# Patient Record
Sex: Male | Born: 1950 | Race: White | Hispanic: No | Marital: Married | Smoking: Never smoker
Health system: Southern US, Community
[De-identification: ages and names within clinical notes are randomized; demographics above are authoritative.]

## PROBLEM LIST (undated history)

## (undated) DIAGNOSIS — J45909 Unspecified asthma, uncomplicated: Secondary | ICD-10-CM

## (undated) DIAGNOSIS — E785 Hyperlipidemia, unspecified: Secondary | ICD-10-CM

## (undated) DIAGNOSIS — I10 Essential (primary) hypertension: Secondary | ICD-10-CM

## (undated) HISTORY — DX: Hyperlipidemia, unspecified: E78.5

## (undated) HISTORY — DX: Unspecified asthma, uncomplicated: J45.909

## (undated) HISTORY — DX: Essential (primary) hypertension: I10

## (undated) HISTORY — PX: COLONOSCOPY: SHX174

---

## 1990-04-25 HISTORY — PX: ARM WOUND REPAIR / CLOSURE: SUR1141

## 2005-01-31 ENCOUNTER — Encounter: Admission: RE | Admit: 2005-01-31 | Discharge: 2005-01-31 | Payer: Self-pay | Admitting: Vascular Surgery

## 2005-02-10 ENCOUNTER — Encounter: Payer: Self-pay | Admitting: Critical Care Medicine

## 2005-02-10 ENCOUNTER — Encounter: Admission: RE | Admit: 2005-02-10 | Discharge: 2005-02-10 | Payer: Self-pay | Admitting: Vascular Surgery

## 2005-02-14 ENCOUNTER — Ambulatory Visit (HOSPITAL_COMMUNITY): Admission: RE | Admit: 2005-02-14 | Discharge: 2005-02-14 | Payer: Self-pay | Admitting: Cardiothoracic Surgery

## 2005-02-14 ENCOUNTER — Encounter (INDEPENDENT_AMBULATORY_CARE_PROVIDER_SITE_OTHER): Payer: Self-pay | Admitting: Specialist

## 2005-05-12 ENCOUNTER — Encounter: Admission: RE | Admit: 2005-05-12 | Discharge: 2005-05-12 | Payer: Self-pay | Admitting: Cardiothoracic Surgery

## 2005-05-20 ENCOUNTER — Ambulatory Visit: Payer: Self-pay | Admitting: Critical Care Medicine

## 2005-06-10 ENCOUNTER — Ambulatory Visit: Payer: Self-pay | Admitting: Critical Care Medicine

## 2005-07-14 ENCOUNTER — Ambulatory Visit: Payer: Self-pay | Admitting: Critical Care Medicine

## 2005-10-11 ENCOUNTER — Ambulatory Visit: Payer: Self-pay | Admitting: Critical Care Medicine

## 2006-03-22 ENCOUNTER — Ambulatory Visit: Payer: Self-pay | Admitting: Critical Care Medicine

## 2006-07-26 IMAGING — CT CT BIOPSY
1 series · 14 of 33 positions shown, 18 images · non-contrast
Comparison: Procedure related pneumothorax evacuated with a pneumocentesis.

CLINICAL DATA: 54-year-old male who has a 2.3 cm right lower lobe lobulated mass originally identified by CT of the abdomen.  The patient had a CT scan subsequently on 02/10/2005 demonstrating the lobulated branching type lesion with adjacent pneumatoceles and blebs.  The findings are confined to the right lower lobe.  
Radiologist:  Dr. Lightfoot
Guidance:  Noncontrast CT

[Series 2: routine chest · axial · 0.76mm/px · z∈[-224,-144]mm · 14 of 109 slices shown, 18 images]
[im 9/109  mediastinal]
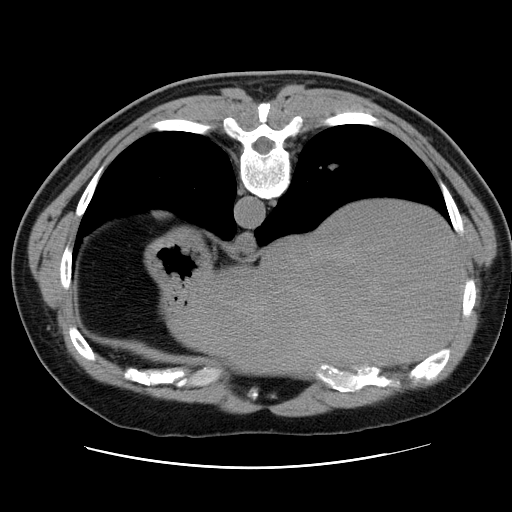
[im 9/109  lung]
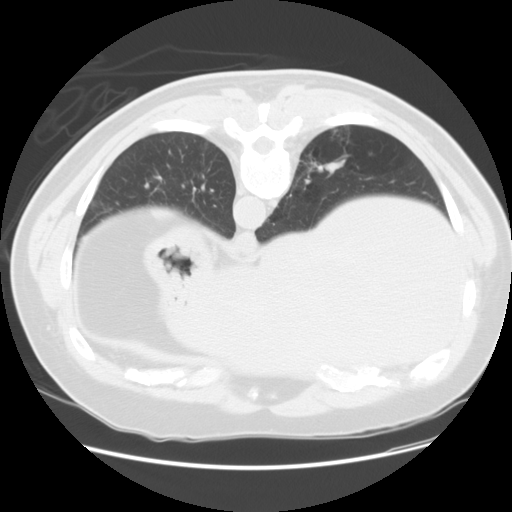
[im 17/109  lung]
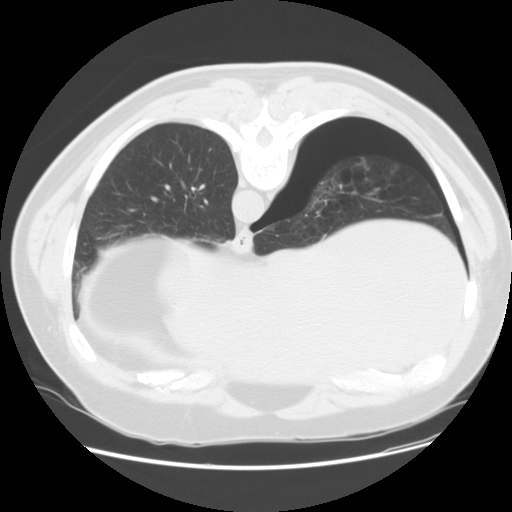
[im 22/109  lung]
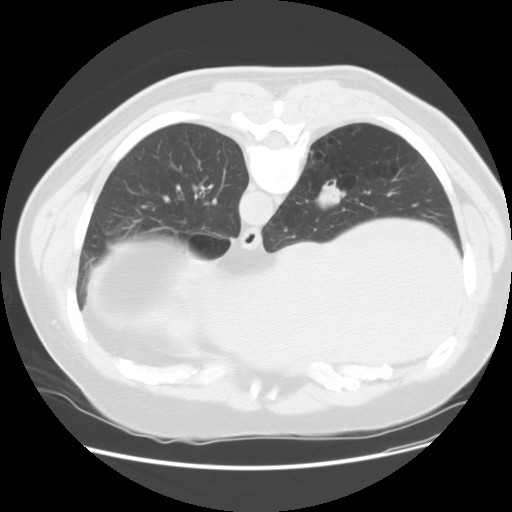
[im 29/109  lung]
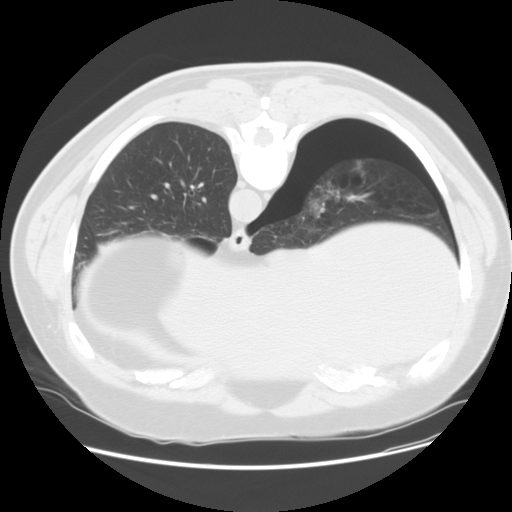
[im 37/109  mediastinal]
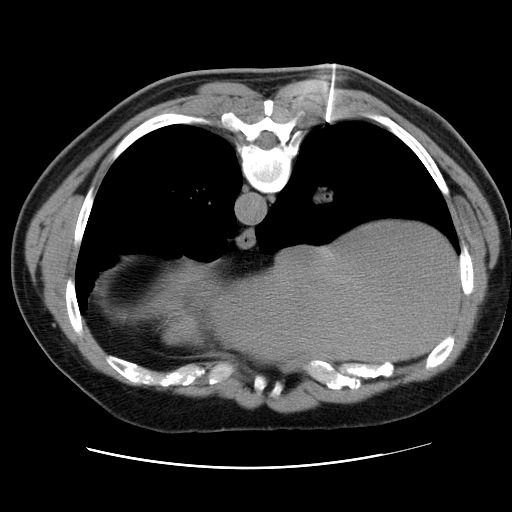
[im 37/109  lung]
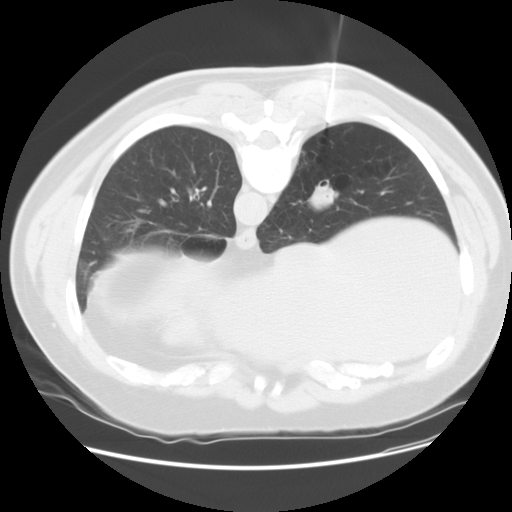
[im 45/109  lung]
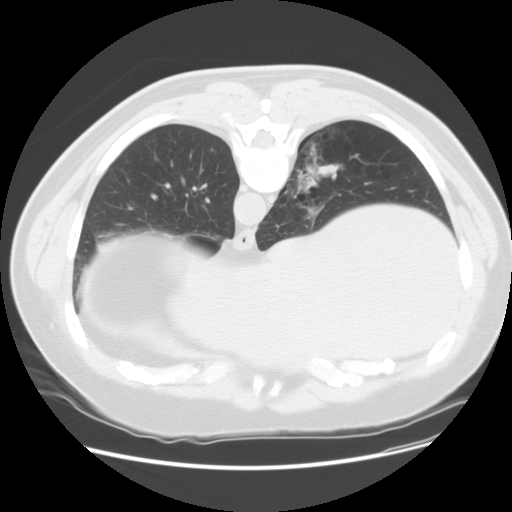
[im 53/109  lung]
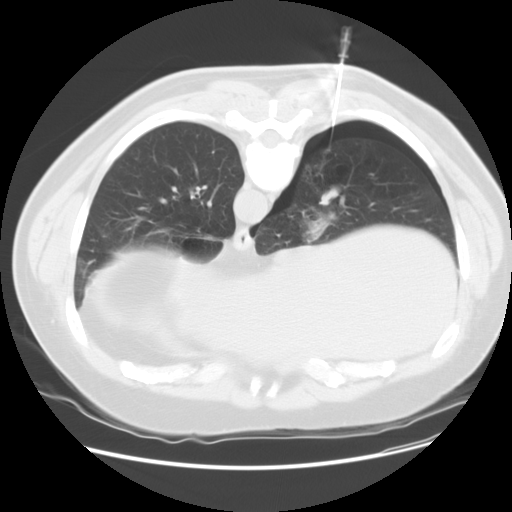
[im 59/109  lung]
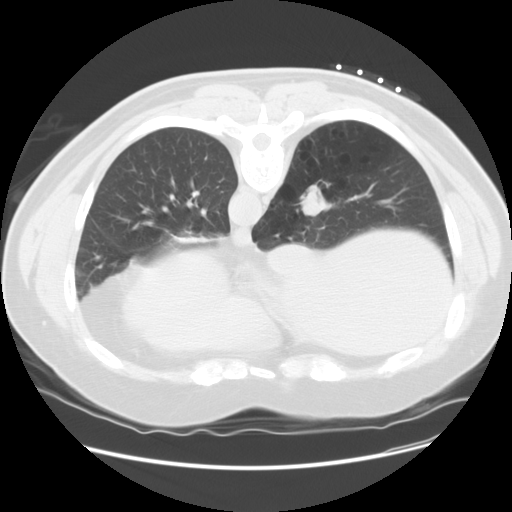
[im 65/109  mediastinal]
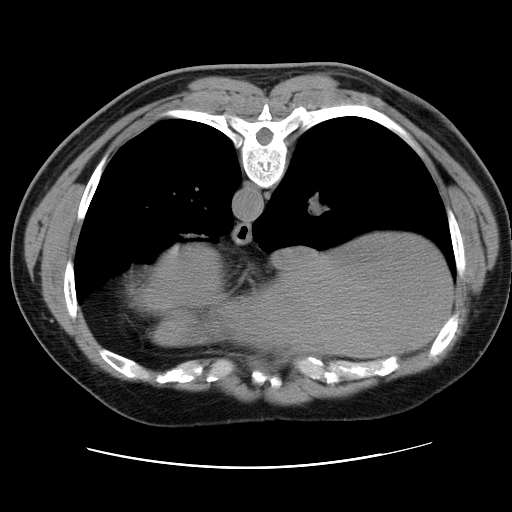
[im 65/109  lung]
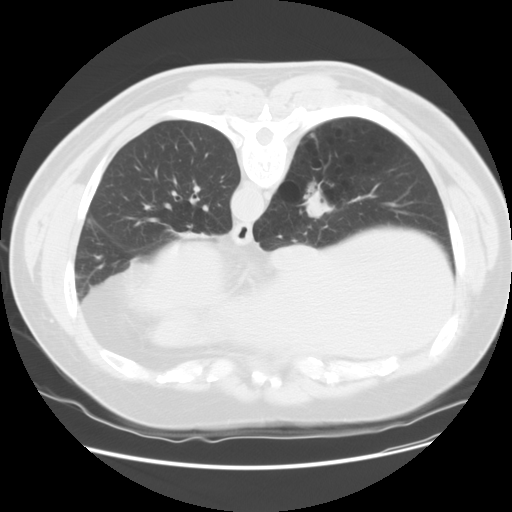
[im 77/109  lung]
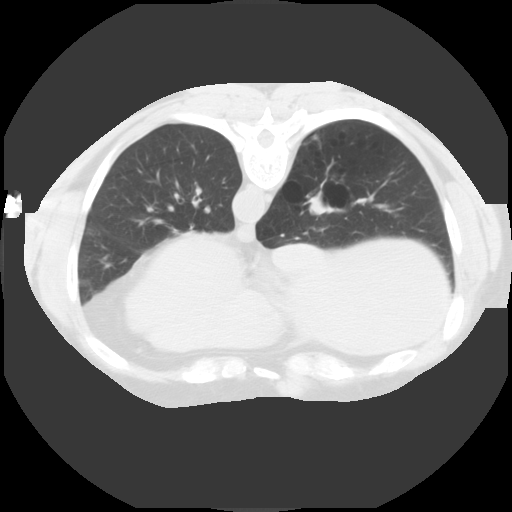
[im 85/109  lung]
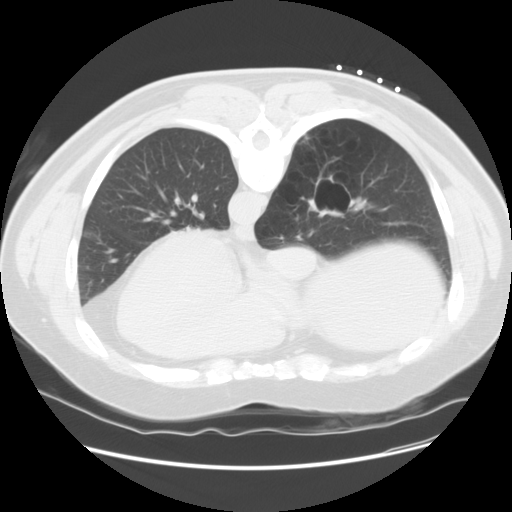
[im 89/109  lung]
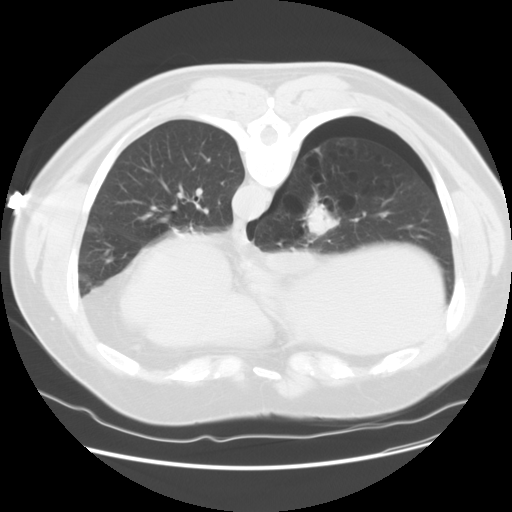
[im 95/109  mediastinal]
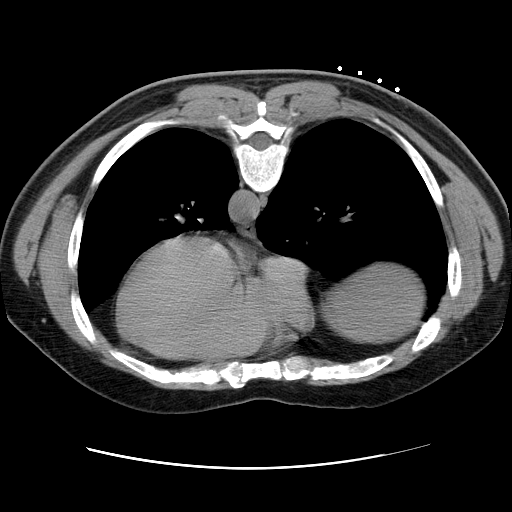
[im 95/109  lung]
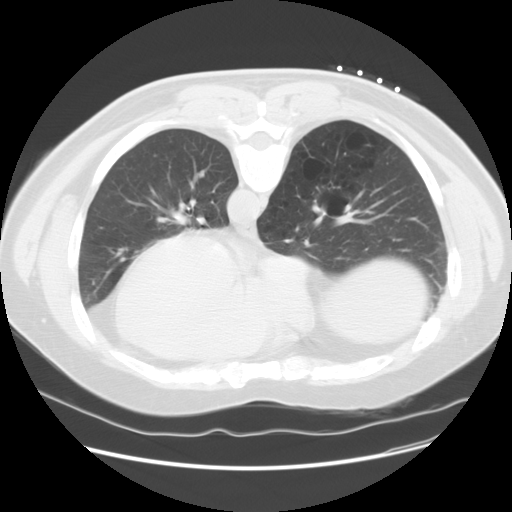
[im 101/109  lung]
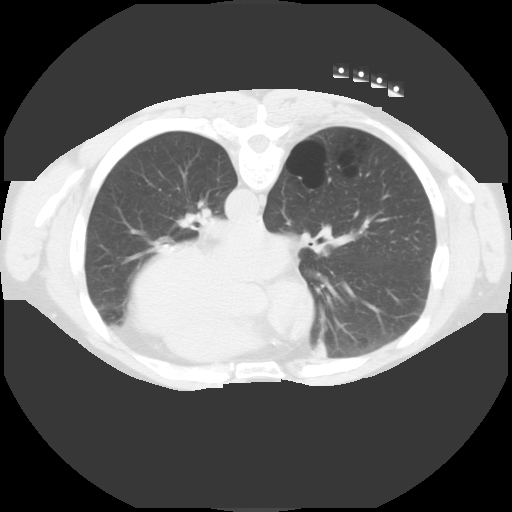

[14 of 33 positions shown; findings below may reference images not displayed]

CT GUIDED RIGHT LOWER LOBE MASS BIOPSY:
Procedure/Findings:  Informed consent was obtained from the patient.  Review of the most recent CT demonstrated the RLL lesion to be along adjacent vascular markings with a branching configuration.  Prior to biopsy, CT angiogram of the chest was performed to exclude an aneurysm. The CTA clearly demonstrated the lesion to be within the adjacent peribronchial parenchymal area with adjacent vascularity.  The lesion does not have significant enhancement and does not have the appearance of an aneurysm.  Therefore, biopsy will be performed.  
The patient was repositioned prone.  Noncontrast CT imaging was performed through the right lower lobe to localize the lesion.  From a posterior paramidline intercostal approach, a 19-gauge introducer needle was advanced with CT guidance along the margin of a lesion medially.  Needle position was confirmed with CT during advancement.  From this location, one 22 Auad and two 20 gauge Westcott needles were utilized for FNA biopsy.  Samples were prepared with cytotechnologist.  Initial quick stain revealed histocytes, bronchial epithelial cells, and mucous.  At the request pathology, additional FNA aspirates were performed.  Prior to this, the needle was repositioned more centrally.  Needle position was reconfirmed along the margin of the lesion.  From this location, three additional FNA biopsies were performed in a similar fashion.  Samples were prepared by the cytotechnologist again.  The needle was retracted and removed.  There was a small procedure related pneumothorax.  The air was almost completely evacuated with a CT guided pneumocentesis and syringe aspiration.  At the conclusion of the procedure, there was a miniscule less than 5% pneumothorax with surrounding hemorrhage about the right lower lobe biopsied lesion.  The patient remained asymptomatic and tolerated the procedure well.
IMPRESSION: 1.  Right lower lobe lobulated mass FNA biopsy with CT guidance as described.  
2.  Procedure related pneumothorax treated with pneumocentesis with near complete evacuation of the air.  The patient remains asymptomatic and will be observed for three hours with two followup chest x-rays.

## 2006-10-21 IMAGING — CT CT CHEST LIMITED W/O CM
1 series · 15 of 33 positions shown, 19 images · IV contrast (agent unspecified)
Comparison: [REDACTED] chest x-ray, 02/14/05 and [REDACTED] chest CT,  02/10/05 and02/14/05.

CLINICAL DATA: Follow-up right lower lobe cyst versus mass post biopsy 02/14/05.
LIMITED CHEST CT WITHOUT CONTRAST:
TECHNIQUE: 2.5 mm collimated axial images were obtained through region of interest at the mid through lower lung fields with no IV contrast.

[Series 2: — · axial · 0.70mm/px · z∈[-238,-112]mm · 15 of 60 slices shown, 19 images]
[im 5/60  mediastinal]
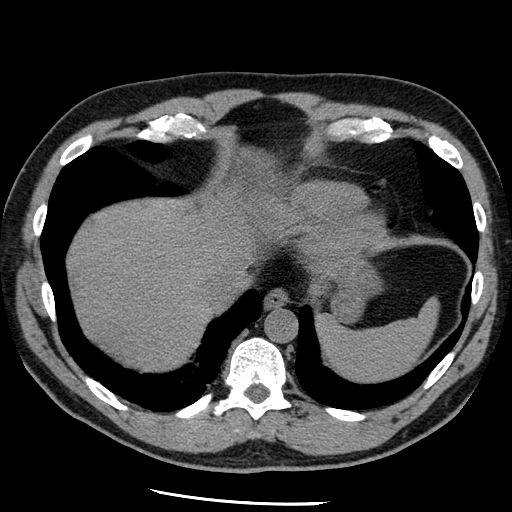
[im 5/60  lung]
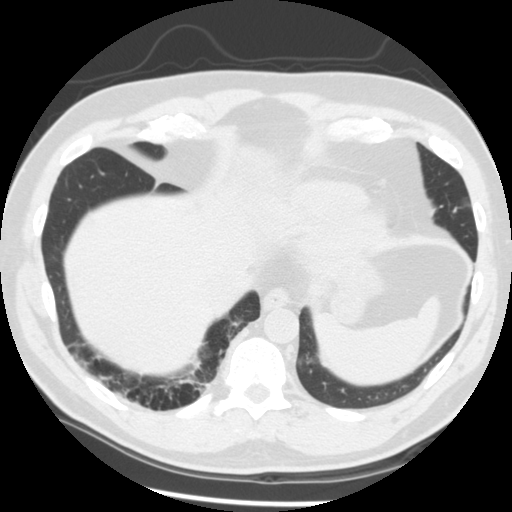
[im 9/60  lung]
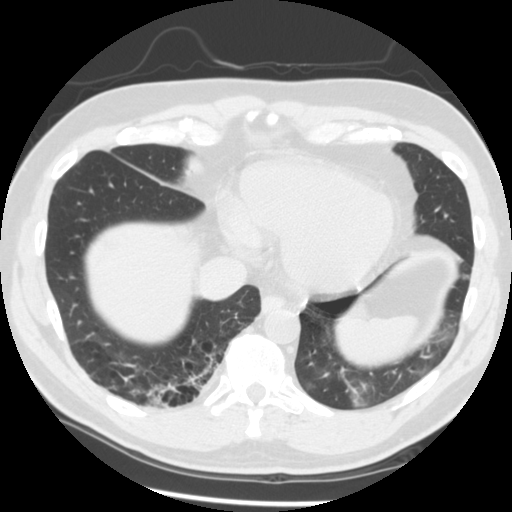
[im 12/60  lung]
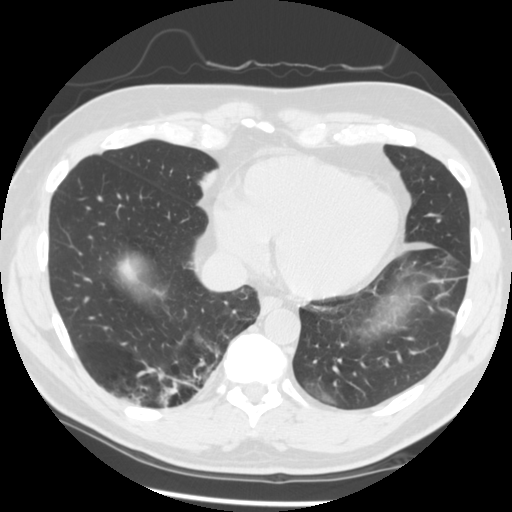
[im 16/60  lung]
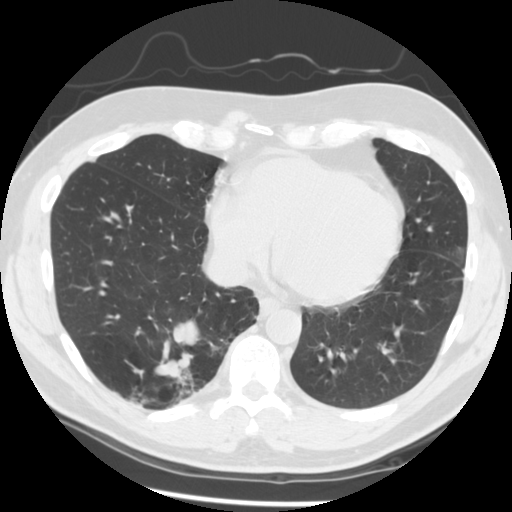
[im 20/60  mediastinal]
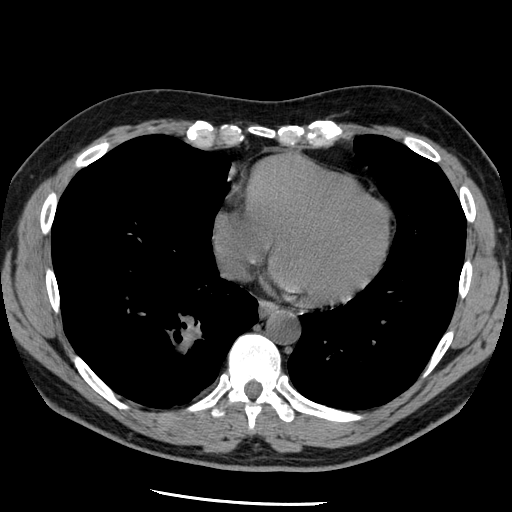
[im 20/60  lung]
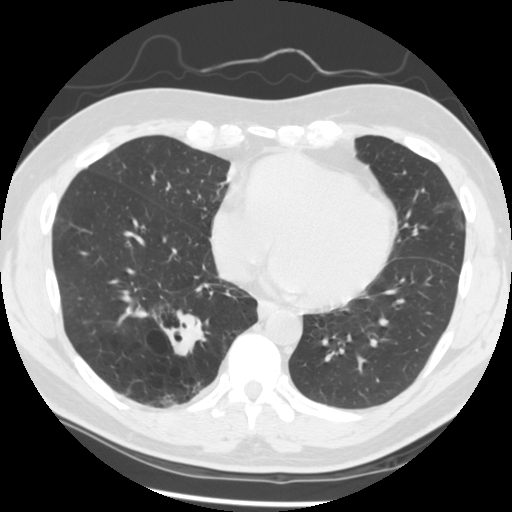
[im 24/60  lung]
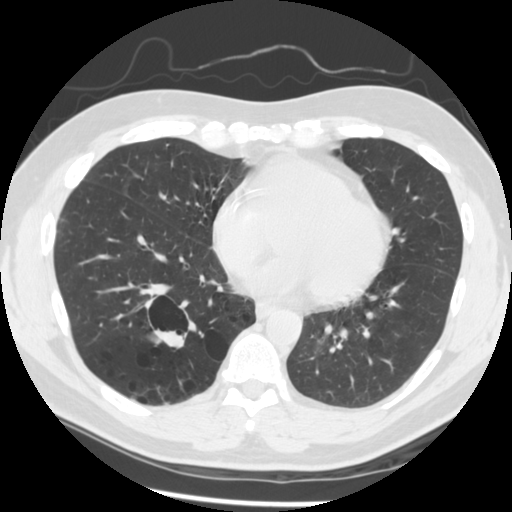
[im 27/60  lung]
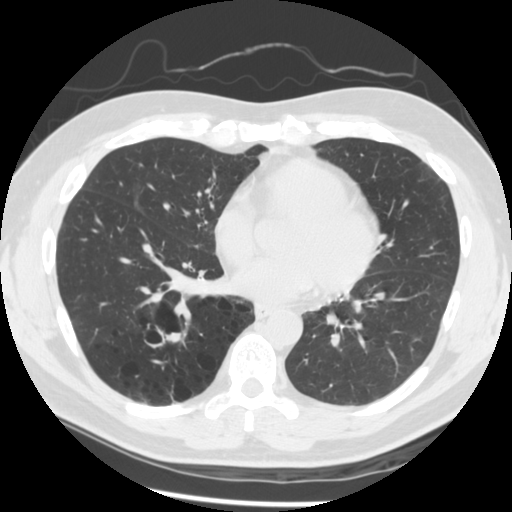
[im 31/60  lung]
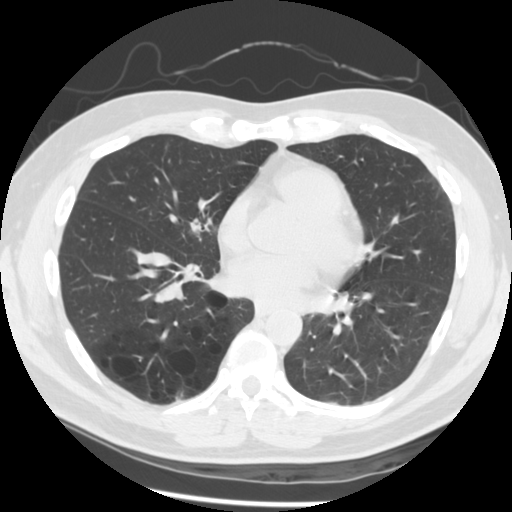
[im 34/60  mediastinal]
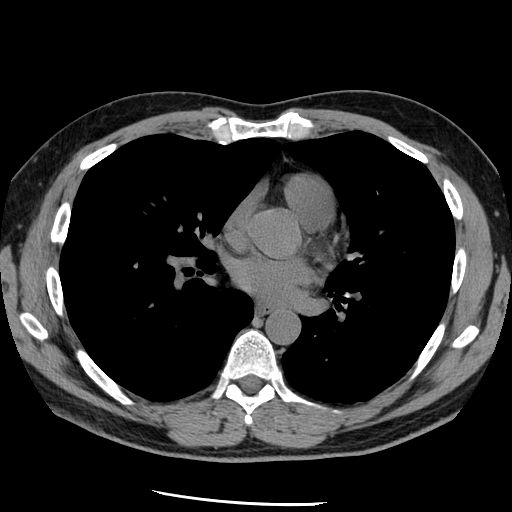
[im 34/60  lung]
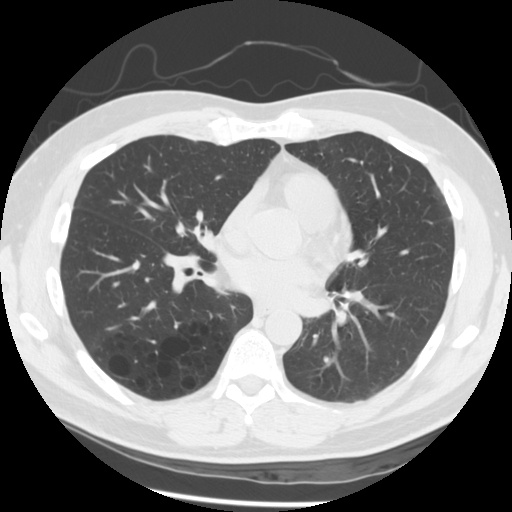
[im 36/60  lung]
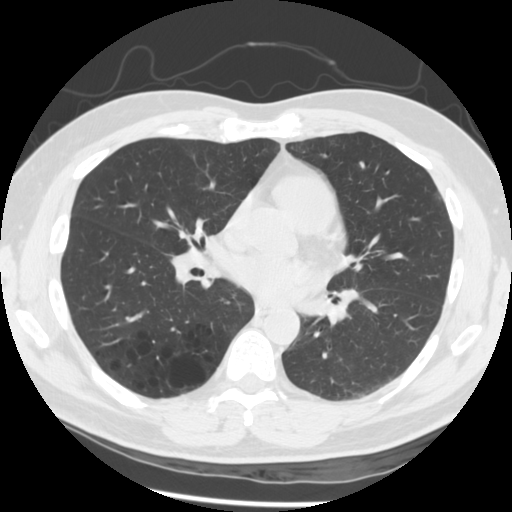
[im 40/60  lung]
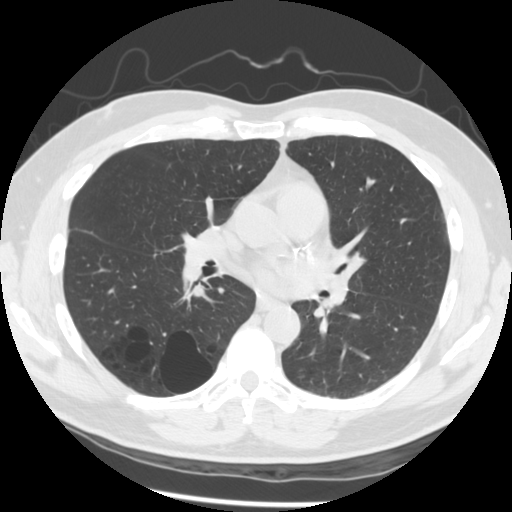
[im 44/60  lung]
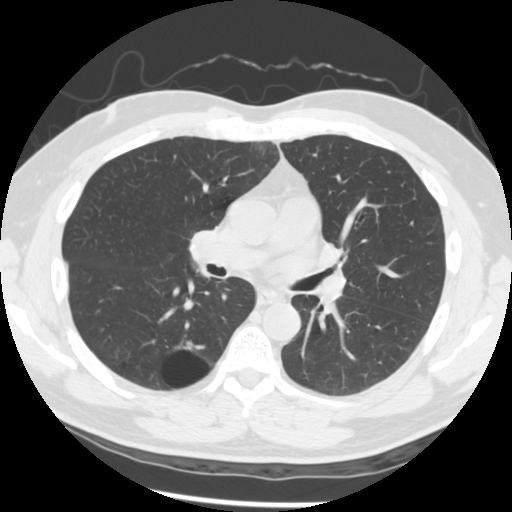
[im 48/60  mediastinal]
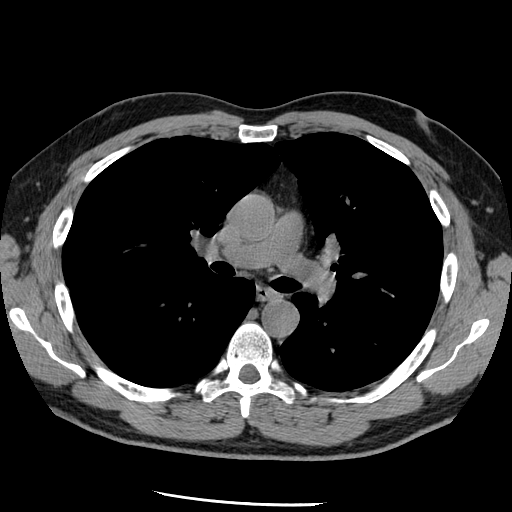
[im 48/60  lung]
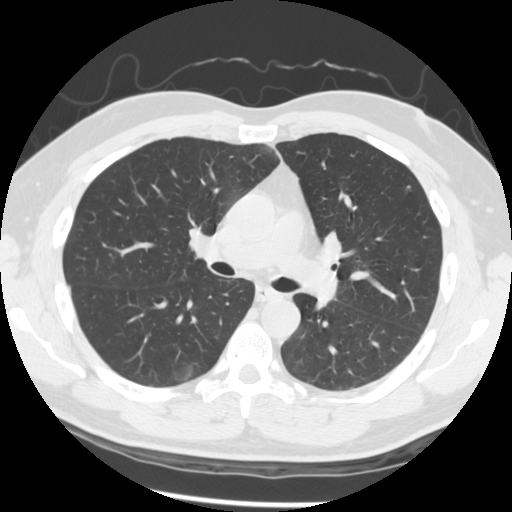
[im 51/60  lung]
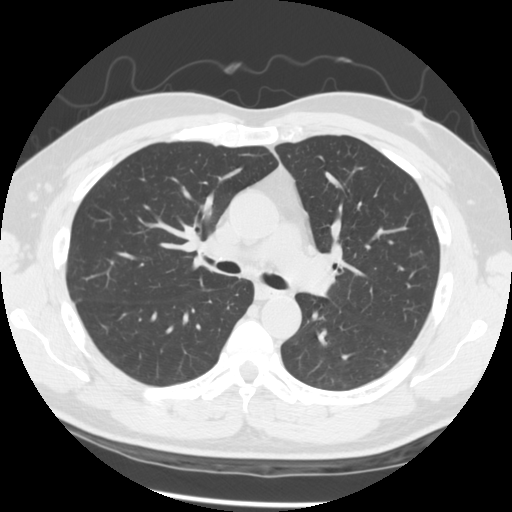
[im 55/60  lung]
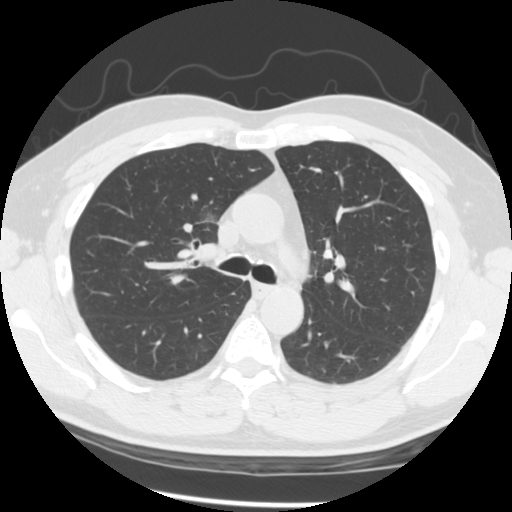

[15 of 33 positions shown; findings below may reference images not displayed]

FINDINGS: Since the prior study, slightly greater fluid component of air fluid level in thin-walled probable pneumatocele.  Subjacent lobulated opacity with peripheral air bronchograms is unchanged in size with branching features especially inferiorly.  No change in adjacent focal right lower lobe multicystic thin-walled air cysts and subpleural blebs.  The soft tissue component is stable as measured 02/10/05, currently measuring 2.5 cm AP x 2.4 cm wide (previously 2.6 x 2.2 cm) (current image 43 and prior image 37).  No new mediastinal, hilar adenopathy/mass is seen.  Atheromatous vascular calcifications with normal sized heart are noted including coronary arteries.
IMPRESSION: 1.  Essentially stable numerous benign appearing right lower lobe air cysts/pneumatoceles with slightly greater fluid component of single air fluid level in more superior air cyst.
2.    Essentially stable subjacent lobulated opacity again favoring benign etiology of fluid-filled or infected pneumatocele.  
3.  Calcified coronary artery disease. 
4.  Otherwise no active disease.

## 2007-02-20 DIAGNOSIS — J449 Chronic obstructive pulmonary disease, unspecified: Secondary | ICD-10-CM

## 2007-02-20 DIAGNOSIS — J45909 Unspecified asthma, uncomplicated: Secondary | ICD-10-CM | POA: Insufficient documentation

## 2007-09-26 ENCOUNTER — Ambulatory Visit: Payer: Self-pay | Admitting: Critical Care Medicine

## 2007-09-26 DIAGNOSIS — Q330 Congenital cystic lung: Secondary | ICD-10-CM | POA: Insufficient documentation

## 2007-10-10 ENCOUNTER — Telehealth: Payer: Self-pay | Admitting: Critical Care Medicine

## 2007-10-11 ENCOUNTER — Ambulatory Visit: Payer: Self-pay | Admitting: Critical Care Medicine

## 2007-10-24 ENCOUNTER — Ambulatory Visit: Payer: Self-pay | Admitting: Critical Care Medicine

## 2008-01-21 ENCOUNTER — Ambulatory Visit: Payer: Self-pay | Admitting: Critical Care Medicine

## 2008-04-23 ENCOUNTER — Ambulatory Visit: Payer: Self-pay | Admitting: Critical Care Medicine

## 2010-05-16 ENCOUNTER — Encounter: Payer: Self-pay | Admitting: Cardiothoracic Surgery

## 2010-09-10 NOTE — Assessment & Plan Note (Signed)
[facility]                             PULMONARY OFFICE NOTE   NAME:Jonathan Soto                         MRN:          [id number]  DATE:03/22/2006                            DOB:          Oct 08, 1950    Jonathan Soto returns today in followup. He cycled off Asmanex in  September. He has had no active complaints, no cough, shortness of  breath, or any other type of respiratory complaints. He is out hunting  every day without difficulty.   PHYSICAL EXAMINATION:  VITAL SIGNS:  Temperature 98, blood pressure  128/76, pulse 90, saturation, 96% on room air.  CHEST:  Showed to be completely clear without evidence wheeze, rale or  rhonchi.  CARDIAC:  Showed a regular rate and rhythm without S3, normal S1, S2.  ABDOMEN:  Soft, nontender.  EXTREMITIES:  Showed no edema or clubbing.  SKIN:  Clear.  NEUROLOGIC:  Intact.  HEENT:  Showed no jugular venous distention, no lymphadenopathy.  Oropharynx clear.  NECK:  Supple.   IMPRESSION:  History of asthmatic bronchitis with __________  in the  upper lobes now resolved. The patient's airway inflammation is stable at  this time.   PLAN:  The plan at this time is for the patient to maintain off Asmanex  and will see this patient back on an as needed basis. He is call sooner  if difficulties arise.     Charlcie Cradle Delford Field, MD, [facility]  Electronically Signed    PEW/MedQ  DD: 03/22/2006  DT: 03/22/2006  Job #: [id number]   cc:   Nadine Counts

## 2017-01-24 ENCOUNTER — Encounter (INDEPENDENT_AMBULATORY_CARE_PROVIDER_SITE_OTHER): Payer: Self-pay | Admitting: Orthopaedic Surgery

## 2017-01-24 ENCOUNTER — Ambulatory Visit (INDEPENDENT_AMBULATORY_CARE_PROVIDER_SITE_OTHER): Payer: Medicare Other | Admitting: Orthopaedic Surgery

## 2017-01-24 ENCOUNTER — Ambulatory Visit (INDEPENDENT_AMBULATORY_CARE_PROVIDER_SITE_OTHER): Payer: Medicare Other

## 2017-01-24 DIAGNOSIS — M25571 Pain in right ankle and joints of right foot: Secondary | ICD-10-CM
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
## 2021-07-26 ENCOUNTER — Telehealth: Payer: Self-pay

## 2021-07-26 NOTE — Telephone Encounter (Signed)
Opened in error

## 2021-08-31 ENCOUNTER — Encounter: Payer: Self-pay | Admitting: Gastroenterology

## 2021-09-13 ENCOUNTER — Encounter: Payer: Self-pay | Admitting: Gastroenterology

## 2021-10-11 ENCOUNTER — Ambulatory Visit (AMBULATORY_SURGERY_CENTER): Payer: Self-pay | Admitting: *Deleted

## 2021-10-11 VITALS — Ht 73.0 in | Wt 206.0 lb

## 2021-10-11 DIAGNOSIS — Z800 Family history of malignant neoplasm of digestive organs: Secondary | ICD-10-CM

## 2021-10-11 DIAGNOSIS — Z1211 Encounter for screening for malignant neoplasm of colon: Secondary | ICD-10-CM

## 2021-10-11 NOTE — Progress Notes (Signed)
No egg or soy allergy known to patient  No issues known to pt with past sedation with any surgeries or procedures Patient denies ever being told they had issues or difficulty with intubation  No FH of Malignant Hyperthermia Pt is not on diet pills Pt is not on  home 02  Pt is not on blood thinners  Pt denies issues with constipation  No A fib or A flutter   PV completed in person. Pt verified name, DOB, address and insurance during PV today.  Pt given instruction packet with consent form to read and sign after procedure and instructions explained and questions answered. Pt encouraged to call with questions or issues.  If pt has My chart, procedure instructions sent via My Chart    

## 2021-11-02 ENCOUNTER — Encounter: Payer: Self-pay | Admitting: Certified Registered Nurse Anesthetist

## 2021-11-03 ENCOUNTER — Encounter: Payer: Self-pay | Admitting: Gastroenterology

## 2021-11-08 ENCOUNTER — Encounter: Payer: Self-pay | Admitting: Gastroenterology

## 2021-11-08 ENCOUNTER — Ambulatory Visit (AMBULATORY_SURGERY_CENTER): Payer: Medicare Other | Admitting: Gastroenterology

## 2021-11-08 VITALS — BP 146/68 | HR 45 | Temp 97.7°F | Resp 11 | Ht 73.0 in | Wt 206.0 lb

## 2021-11-08 DIAGNOSIS — Z800 Family history of malignant neoplasm of digestive organs: Secondary | ICD-10-CM | POA: Diagnosis not present

## 2021-11-08 DIAGNOSIS — Z1211 Encounter for screening for malignant neoplasm of colon: Secondary | ICD-10-CM

## 2021-11-08 MED ORDER — SODIUM CHLORIDE 0.9 % IV SOLN: 500.0000 mL | Freq: Once | INTRAVENOUS | Status: DC

## 2021-11-08 NOTE — Progress Notes (Signed)
VS completed by CW.   Pt's states no medical or surgical changes since previsit or office visit.  

## 2021-11-08 NOTE — Op Note (Addendum)
[facility] Patient Name: Jonathan Soto Procedure Date: 11/08/2021 9:55 AM MRN: [id number] Endoscopist: Lynann Bologna , MD Age: 71 Referring MD:  Date of Birth: 06-28-1950 Gender: Male Account #: [id number] Procedure:                Colonoscopy Indications:              Screening in patient at increased risk: Colorectal                            cancer in father before age 35 Medicines:                Monitored Anesthesia Care Procedure:                Pre-Anesthesia Assessment:                           - Prior to the procedure, a History and Physical                            was performed, and patient medications and                            allergies were reviewed. The patient's tolerance of                            previous anesthesia was also reviewed. The risks                            and benefits of the procedure and the sedation                            options and risks were discussed with the patient.                            All questions were answered, and informed consent                            was obtained. Prior Anticoagulants: The patient has                            taken no previous anticoagulant or antiplatelet                            agents. ASA Grade Assessment: II - A patient with                            mild systemic disease. After reviewing the risks                            and benefits, the patient was deemed in                            satisfactory condition to undergo the procedure.  After obtaining informed consent, the colonoscope                            was passed under direct vision. Throughout the                            procedure, the patient's blood pressure, pulse, and                            oxygen saturations were monitored continuously. The                            CF [id number] #[id number] was introduced through the anus                            and advanced to the the cecum,  identified by                            appendiceal orifice and ileocecal valve. The                            colonoscopy was performed without difficulty. The                            patient tolerated the procedure well. The quality                            of the bowel preparation was good. The ileocecal                            valve, appendiceal orifice, and rectum were                            photographed. Scope In: 9:59:07 AM Scope Out: 10:13:56 AM Scope Withdrawal Time: 0 hours 10 minutes 10 seconds  Total Procedure Duration: 0 hours 14 minutes 49 seconds  Findings:                 A few medium-mouthed diverticula were found in the                            sigmoid colon.                           Non-bleeding internal hemorrhoids were found during                            retroflexion. The hemorrhoids were small and Grade                            I (internal hemorrhoids that do not prolapse).                           The entire examined colon appeared normal on direct  and retroflexion views. Complications:            No immediate complications. Estimated Blood Loss:     Estimated blood loss: none. Impression:               - Mild sigmoid diverticulosis.                           - Non-bleeding internal hemorrhoids.                           - The entire examined colon is normal on direct and                            retroflexion views.                           - No specimens collected. Recommendation:           - Patient has a contact number available for                            emergencies. The signs and symptoms of potential                            delayed complications were discussed with the                            patient. Return to normal activities tomorrow.                            Written discharge instructions were provided to the                            patient.                           - Resume previous  diet.                           - Continue present medications.                           - Repeat colonoscopy is not recommended for                            screening purposes.                           - The findings and recommendations were discussed                            with the patient's family. Lynann Bologna, MD 11/08/2021 10:17:59 AM This report has been signed electronically.

## 2021-11-08 NOTE — Progress Notes (Signed)
[facility] History and Physical   Primary Care Physician:  Eloisa Northern, MD   Reason for Procedure:   Family history of colon cancer- dad at age 72s  Plan:     colonoscopy     HPI: Jonathan Soto is a 71 y.o. male   No nausea, vomiting, heartburn, regurgitation, odynophagia or dysphagia.  No significant diarrhea or constipation.  No melena or hematochezia. No unintentional weight loss. No abdominal pain.  Past Medical History:  Diagnosis Date   Asthma    Hyperlipidemia    Hypertension     Past Surgical History:  Procedure Laterality Date   ARM WOUND REPAIR / CLOSURE  1992   COLONOSCOPY      Prior to Admission medications   Medication Sig Start Date End Date Taking? Authorizing Provider  allopurinol (ZYLOPRIM) 300 MG tablet  12/27/16   [provider]  amLODIPine Besylate 1 MG/ML SOLN     [provider]  FLUZONE HIGH-DOSE 0.5 ML injection TO BE ADMINISTERED BY PHARMACIST FOR IMMUNIZATION Patient not taking: Reported on 10/11/2021 01/19/17   [provider]  HYDROcodone-acetaminophen (HYCET) 7.5-325 mg/15 ml solution TK 10 ML PO NIGHTLY PRN P FOR UP TO 5 DAYS Patient not taking: Reported on 10/11/2021 12/18/16   [provider]  levofloxacin (LEVAQUIN) 750 MG tablet  10/21/16   [provider]  losartan (COZAAR) 50 MG tablet  12/13/16   [provider]  meloxicam (MOBIC) 7.5 MG tablet  12/27/16   [provider]    Current Outpatient Medications  Medication Sig Dispense Refill   allopurinol (ZYLOPRIM) 300 MG tablet  (Patient not taking: Reported on 10/11/2021)     amLODIPine Besylate 1 MG/ML SOLN      FLUZONE HIGH-DOSE 0.5 ML injection TO BE ADMINISTERED BY PHARMACIST FOR IMMUNIZATION (Patient not taking: Reported on 10/11/2021)  0   HYDROcodone-acetaminophen (HYCET) 7.5-325 mg/15 ml solution TK 10 ML PO NIGHTLY PRN P FOR UP TO 5 DAYS (Patient not taking: Reported on 10/11/2021)  0   levofloxacin (LEVAQUIN)  750 MG tablet  (Patient not taking: Reported on 10/11/2021)     losartan (COZAAR) 50 MG tablet  (Patient not taking: Reported on 11/08/2021)     meloxicam (MOBIC) 7.5 MG tablet  (Patient not taking: Reported on 10/11/2021)     Current Facility-Administered Medications  Medication Dose Route Frequency Provider Last Rate Last Admin   0.9 %  sodium chloride infusion  500 mL Intravenous Once Lynann Bologna, MD        Allergies as of 11/08/2021   (No Known Allergies)    Family History  Problem Relation Age of Onset   Colon cancer Father    Colon polyps Neg Hx    Esophageal cancer Neg Hx    Stomach cancer Neg Hx    Rectal cancer Neg Hx     Social History   Socioeconomic History   Marital status: Married    Spouse name: Not on file   Number of children: Not on file   Years of education: Not on file   Highest education level: Not on file  Occupational History   Not on file  Tobacco Use   Smoking status: Never   Smokeless tobacco: Never  Vaping Use   Vaping Use: Never used  Substance and Sexual Activity   Alcohol use: No   Drug use: Never   Sexual activity: Not on file  Other Topics Concern   Not on file  Social History Narrative  Not on file   Social Determinants of Health   Financial Resource Strain: Not on file  Food Insecurity: Not on file  Transportation Needs: Not on file  Physical Activity: Not on file  Stress: Not on file  Social Connections: Not on file  Intimate Partner Violence: Not on file    Review of Systems: Positive for none All other review of systems negative except as mentioned in the HPI.  Physical Exam: Vital signs in last 24 hours: @VSRANGES @   General:   Alert,  Well-developed, well-nourished, pleasant and cooperative in NAD Lungs:  Clear throughout to auscultation.   Heart:  Regular rate and rhythm; no murmurs, clicks, rubs,  or gallops. Abdomen:  Soft, nontender and nondistended. Normal bowel sounds.   Neuro/Psych:  Alert and  cooperative. Normal mood and affect. A and O x 3    No significant changes were identified.  The patient continues to be an appropriate candidate for the planned procedure and anesthesia.   , MD. [facility] 11/08/2021 9:52 AM@

## 2021-11-08 NOTE — Progress Notes (Signed)
Report given to PACU, vss 

## 2021-11-08 NOTE — Patient Instructions (Signed)
Handout on hemorrhoids and diverticulosis given.  YOU HAD AN ENDOSCOPIC PROCEDURE TODAY AT THE [facility]:   Refer to the procedure report that was given to you for any specific questions about what was found during the examination.  If the procedure report does not answer your questions, please call your gastroenterologist to clarify.  If you requested that your care partner not be given the details of your procedure findings, then the procedure report has been included in a sealed envelope for you to review at your convenience later.  YOU SHOULD EXPECT: Some feelings of bloating in the abdomen. Passage of more gas than usual.  Walking can help get rid of the air that was put into your GI tract during the procedure and reduce the bloating. If you had a lower endoscopy (such as a colonoscopy or flexible sigmoidoscopy) you may notice spotting of blood in your stool or on the toilet paper. If you underwent a bowel prep for your procedure, you may not have a normal bowel movement for a few days.  Please Note:  You might notice some irritation and congestion in your nose or some drainage.  This is from the oxygen used during your procedure.  There is no need for concern and it should clear up in a day or so.  SYMPTOMS TO REPORT IMMEDIATELY:  Following lower endoscopy (colonoscopy or flexible sigmoidoscopy):  Excessive amounts of blood in the stool  Significant tenderness or worsening of abdominal pains  Swelling of the abdomen that is new, acute  Fever of 100F or higher  For urgent or emergent issues, a gastroenterologist can be reached at any hour by calling [phone]. Do not use MyChart messaging for urgent concerns.    DIET:  We do recommend a small meal at first, but then you may proceed to your regular diet.  Drink plenty of fluids but you should avoid alcoholic beverages for 24 hours.  ACTIVITY:  You should plan to take it easy for the rest of today and you should NOT  DRIVE or use heavy machinery until tomorrow (because of the sedation medicines used during the test).    FOLLOW UP: Our staff will call the number listed on your records the next business day following your procedure.  We will call around 7:15- 8:00 am to check on you and address any questions or concerns that you may have regarding the information given to you following your procedure. If we do not reach you, we will leave a message.  If you develop any symptoms (ie: fever, flu-like symptoms, shortness of breath, cough etc.) before then, please call [phone].  If you test positive for Covid 19 in the 2 weeks post procedure, please call and report this information to us.    If any biopsies were taken you will be contacted by phone or by letter within the next 1-3 weeks.  Please call us at [phone] if you have not heard about the biopsies in 3 weeks.    SIGNATURES/CONFIDENTIALITY: You and/or your care partner have signed paperwork which will be entered into your electronic medical record.  These signatures attest to the fact that that the information above on your After Visit Summary has been reviewed and is understood.  Full responsibility of the confidentiality of this discharge information lies with you and/or your care-partner.  

## 2021-11-09 ENCOUNTER — Telehealth: Payer: Self-pay

## 2021-11-09 NOTE — Telephone Encounter (Signed)
  Follow up Call-     11/08/2021    9:34 AM  Call back number  Post procedure Call Back phone  # [phone]  Permission to leave phone message Yes     Patient questions:  Do you have a fever, pain , or abdominal swelling? No. Pain Score  0 *  Have you tolerated food without any problems? Yes.    Have you been able to return to your normal activities? Yes.    Do you have any questions about your discharge instructions: Diet   No. Medications  No. Follow up visit  No.  Do you have questions or concerns about your Care? No.  Actions: * If pain score is 4 or above: No action needed, pain <4.

## 2022-04-13 ENCOUNTER — Encounter: Payer: Self-pay | Admitting: Internal Medicine

## 2022-04-13 ENCOUNTER — Ambulatory Visit: Payer: Medicare Other | Admitting: Internal Medicine

## 2022-04-13 VITALS — BP 128/76 | HR 65 | Temp 98.5°F | Resp 18 | Ht 73.0 in | Wt 223.2 lb

## 2022-04-13 DIAGNOSIS — J40 Bronchitis, not specified as acute or chronic: Secondary | ICD-10-CM

## 2022-04-13 DIAGNOSIS — I10 Essential (primary) hypertension: Secondary | ICD-10-CM

## 2022-04-13 DIAGNOSIS — R7303 Prediabetes: Secondary | ICD-10-CM | POA: Insufficient documentation

## 2022-04-13 DIAGNOSIS — E7800 Pure hypercholesterolemia, unspecified: Secondary | ICD-10-CM

## 2022-04-13 MED ORDER — DOXYCYCLINE MONOHYDRATE 100 MG PO CAPS: 100.0000 mg | ORAL_CAPSULE | Freq: Two times a day (BID) | ORAL | 0 refills | Status: DC

## 2022-04-13 NOTE — Assessment & Plan Note (Signed)
I am going to start him on doxycycline at this time.  Continue with supportive care.

## 2022-04-13 NOTE — Assessment & Plan Note (Signed)
We will check a HgBA1c on him today. 

## 2022-04-13 NOTE — Assessment & Plan Note (Signed)
He has eaten but we will check a FLP knowing his TG level will be elevated.

## 2022-04-13 NOTE — Assessment & Plan Note (Signed)
His BP is well controlled.  We will continue his current meds. 

## 2022-04-13 NOTE — Progress Notes (Signed)
Office Visit  Subjective   Patient ID: Jonathan Soto   DOB: [birth year]   Age: 71 y.o.   MRN: [id number]   Chief Complaint Chief Complaint  Patient presents with   Hypertension    67m F/U  Needs Lasix and potassium refill     History of Present Illness The patient is a 71 year old Caucasian/White male who presents for a followup evaluation of hypertension. Since his last visit, he states he has not had any problems.  He does check his BP at home where his SBP runs in the 120's.  The patient's current medications include: amlodipine 5 mg daily and losartan 100 mg daily.  He has had swelling on his legs in the past where has taken prn lasix which helpws.   The patient has been tolerating his medications well. The patient denies any chest pain, shortness of breath, headaches, dizziness, generalized weakness or other problems.  He reports there have been no other symptoms noted.   The patient also returns today for routine followup on her cholesterol. Overall, he states she is doing well and is without any complaints or problems at this time. She specifically denies abdominal pain, nausea, vomiting, diarrhea, myalgias, and fatigue. She remains on dietary management as well as a regular exercise program.  He last ate at 11:30 this AM so we will have to hold on lab draws today.   The patient is a 71 year old male who returns for a follow-up visit of his prediabetes. He controls his prediabetes with diet and exercise.  He specifically denies unexplained abdominal pain, nausea or vomiting or other problems.  He does not check his FSBS at home.  His last HgbA1c was done 06/2021 and was 6%.   The patient is a 71 year old male who presents with upper respiratory tract symptoms which began 2 weeks.  This started as sinus congestion with yellow nasal discharge.  This has now moved down into his chest with chest congestion with cough productive of yellow sputum that is mild.   He denies any headaches, myaglias,  sore throat, fever, shortness of breath, nausea, vomiting, diarrhea, or wheezing. Alleviating factors: mucinex. The patient's past medical history is noncontributory. He did have a flu vaccine this past season. He has had 3 COVID-19 vaccines including 1 booster.     Past Medical History Past Medical History:  Diagnosis Date   Asthma    Hyperlipidemia    Hypertension      Allergies No Known Allergies   Review of Systems Review of Systems  Constitutional:  Negative for chills and fever.  Eyes:  Negative for blurred vision and double vision.  Respiratory:  Positive for cough and sputum production. Negative for hemoptysis, shortness of breath and wheezing.   Cardiovascular:  Negative for chest pain and palpitations.  Gastrointestinal:  Negative for constipation, diarrhea, nausea and vomiting.  Musculoskeletal:  Negative for myalgias.  Skin:  Negative for itching and rash.  Neurological:  Negative for dizziness, weakness and headaches.  Psychiatric/Behavioral:  Negative for depression. The patient is not nervous/anxious.        Objective:    Vitals BP 128/76 (BP Location: Right Arm, Patient Position: Sitting, Cuff Size: Normal)   Pulse 65   Temp 98.5 F (36.9 C) (Temporal)   Resp 18   Ht 6\' 1"  (1.854 m)   Wt 223 lb 3.2 oz (101.2 kg)   SpO2 97%   BMI 29.45 kg/m    Physical Examination Physical Exam  Constitutional:      Appearance: Normal appearance. He is not ill-appearing.  HENT:     Right Ear: Tympanic membrane, ear canal and external ear normal.     Left Ear: Tympanic membrane, ear canal and external ear normal.     Nose: Congestion present.     Mouth/Throat:     Mouth: Mucous membranes are moist.     Pharynx: Oropharynx is clear. No posterior oropharyngeal erythema.  Cardiovascular:     Rate and Rhythm: Normal rate and regular rhythm.     Pulses: Normal pulses.     Heart sounds: No murmur heard.    No friction rub. No gallop.  Pulmonary:     Effort: Pulmonary  effort is normal. No respiratory distress.     Breath sounds: No wheezing, rhonchi or rales.  Abdominal:     General: Abdomen is flat. Bowel sounds are normal. There is no distension.     Palpations: Abdomen is soft.     Tenderness: There is no abdominal tenderness.  Musculoskeletal:     Right lower leg: No edema.     Left lower leg: No edema.  Skin:    General: Skin is warm and dry.     Findings: No rash.  Neurological:     General: No focal deficit present.     Mental Status: He is alert and oriented to person, place, and time.  Psychiatric:        Mood and Affect: Mood normal.        Behavior: Behavior normal.        Assessment & Plan:   Essential hypertension His BP is well controlled.  We will continue his current meds.  Bronchitis I am going to start him on doxycycline at this time.  Continue with supportive care.  Prediabetes We will check a HgBA1c on him today.  Hypercholesterolemia He has eaten but we will check a FLP knowing his TG level will be elevated.    Return in about 3 months (around 07/13/2022).   Crist Fat, MD

## 2022-04-14 LAB — CMP14 + ANION GAP
ALT: 17 IU/L (ref 0–44)
AST: 16 IU/L (ref 0–40)
Albumin/Globulin Ratio: 1.8 (ref 1.2–2.2)
Albumin: 4.1 g/dL (ref 3.8–4.8)
Alkaline Phosphatase: 89 IU/L (ref 44–121)
Anion Gap: 12 mmol/L (ref 10.0–18.0)
BUN/Creatinine Ratio: 16 (ref 10–24)
BUN: 15 mg/dL (ref 8–27)
Bilirubin Total: <0.2 mg/dL (ref 0.0–1.2)
CO2: 25 mmol/L (ref 20–29)
Calcium: 9.4 mg/dL (ref 8.6–10.2)
Chloride: 104 mmol/L (ref 96–106)
Creatinine, Ser: 0.94 mg/dL (ref 0.76–1.27)
Globulin, Total: 2.3 g/dL (ref 1.5–4.5)
Glucose: 119 mg/dL — ABNORMAL HIGH (ref 70–99)
Potassium: 4.2 mmol/L (ref 3.5–5.2)
Sodium: 141 mmol/L (ref 134–144)
Total Protein: 6.4 g/dL (ref 6.0–8.5)
eGFR: 86 mL/min/{1.73_m2} (ref >59)

## 2022-04-14 LAB — LIPID PANEL
Chol/HDL Ratio: 2.9 ratio (ref 0.0–5.0)
Cholesterol, Total: 158 mg/dL (ref 100–199)
HDL: 54 mg/dL (ref >39)
LDL Chol Calc (NIH): 67 mg/dL (ref 0–99)
Triglycerides: 228 mg/dL — ABNORMAL HIGH (ref 0–149)
VLDL Cholesterol Cal: 37 mg/dL (ref 5–40)

## 2022-04-14 LAB — HEMOGLOBIN A1C
Est. average glucose Bld gHb Est-mCnc: 128 mg/dL
Hgb A1c MFr Bld: 6.1 % — ABNORMAL HIGH (ref 4.8–5.6)

## 2022-05-23 NOTE — Progress Notes (Signed)
Patient called.  Patient aware.  His prediabetes is controlled.  His TG level is a bit elevated- cut fats in diet and exercise.

## 2022-06-01 ENCOUNTER — Ambulatory Visit (INDEPENDENT_AMBULATORY_CARE_PROVIDER_SITE_OTHER): Payer: Medicare Other | Admitting: Allergy and Immunology

## 2022-06-01 ENCOUNTER — Encounter: Payer: Self-pay | Admitting: Allergy and Immunology

## 2022-06-01 VITALS — BP 126/82 | HR 61 | Resp 16 | Ht 73.0 in | Wt 230.2 lb

## 2022-06-01 DIAGNOSIS — J3089 Other allergic rhinitis: Secondary | ICD-10-CM

## 2022-06-01 DIAGNOSIS — J309 Allergic rhinitis, unspecified: Secondary | ICD-10-CM

## 2022-06-01 DIAGNOSIS — J301 Allergic rhinitis due to pollen: Secondary | ICD-10-CM

## 2022-06-01 DIAGNOSIS — J4520 Mild intermittent asthma, uncomplicated: Secondary | ICD-10-CM | POA: Diagnosis not present

## 2022-06-01 NOTE — Progress Notes (Unsigned)
[facility]   NEW PATIENT NOTE  Referring Provider: Garwin Brothers, MD Primary Provider: Garwin Brothers, MD Date of office visit: 06/01/2022    Subjective:   Chief Complaint:  Jonathan Soto (DOB: 1950-05-28) is a 72 y.o. male who presents to the clinic on 06/01/2022 with a chief complaint of Allergies .     HPI: [name] presents to this clinic in evaluation of respiratory tract issues.  He has a long history of asthma and allergic rhinoconjunctivitis since childhood for which she has been on immunotherapy on and off through most of his life.  Most recently he started a course immunotherapy 2 years ago for runny nose and sneezing and nasal congestion and postnasal drip and coughing and wheezing.  He has been utilizing this form of therapy currently at every 1 week administration without any adverse effect and this has helped him significantly.  He would like to continue on this form of therapy but logistically it is difficult him for to receive this form of therapy 45 minutes away from his house and he would like to have this form of therapy administered and our clinic which is less than 15 minutes away from his house.  He has an albuterol nebulizer which he might use about 1 time per year.  He does not use any MDI and elations because they bother his throat.  He does not use any nasal sprays because they bother his nose.  He does not use any antihistamines.  He states that exposure to pollen and cat and dog and deer and dust mite are triggers for his respiratory tract symptoms.  In addition exposures to fumes and molds and since will also set off his nose and eyes and make him cough.  During the spring he wears a mask and goggles when he goes outside.  He informs me that he has a very strong history of lung problems and he has 3 sisters on oxygen, 1 of which is a smoker, and a brother who with lung problems.  He does not know if he has ever been tested for  alpha-1 antitrypsinase deficiency  He has received a flu vaccine, RSV vaccine, and COVID-vaccine this year.  Past Medical History:  Diagnosis Date   Asthma    Hyperlipidemia    Hypertension     Past Surgical History:  Procedure Laterality Date   ARM WOUND REPAIR / CLOSURE  1992   COLONOSCOPY      Allergies as of 06/01/2022   No Known Allergies      Medication List    amLODipine 5 MG tablet Commonly known as: NORVASC Take 5 mg by mouth daily.   losartan 50 MG tablet Commonly known as: COZAAR    Review of systems negative except as noted in HPI / PMHx or noted below:  Review of Systems  Constitutional: Negative.   HENT: Negative.    Eyes: Negative.   Respiratory: Negative.    Cardiovascular: Negative.   Gastrointestinal: Negative.   Genitourinary: Negative.   Musculoskeletal: Negative.   Skin: Negative.   Neurological: Negative.   Endo/Heme/Allergies: Negative.   Psychiatric/Behavioral: Negative.      Family History  Problem Relation Age of Onset   Colon cancer Father    Colon polyps Neg Hx    Esophageal cancer Neg Hx    Stomach cancer Neg Hx    Rectal cancer Neg Hx     Social History   Socioeconomic History  Marital status: Married    Spouse name: Not on file   Number of children: Not on file   Years of education: Not on file   Highest education level: Not on file  Occupational History   Not on file  Tobacco Use   Smoking status: Never   Smokeless tobacco: Never  Vaping Use   Vaping Use: Never used  Substance and Sexual Activity   Alcohol use: No   Drug use: Never   Sexual activity: Not on file  Other Topics Concern   Not on file  Social History Narrative   Not on file   Environmental and Social history  Lives in a house with a dry environment, no animals located inside the household, carpet in the bedroom, no plastic on the bed, no plastic on the pillow, no smoking ongoing with inside the household.  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  Objective:   Vitals:   06/01/22 0856  BP: 126/82  Pulse: 61  Resp: 16  SpO2: 96%   Height: 6\' 1"  (185.4 cm) Weight: 230 lb 3.2 oz (104.4 kg)  Physical Exam Constitutional:      Appearance: He is not diaphoretic.  HENT:     Head: Normocephalic.     Right Ear: Tympanic membrane, ear canal and external ear normal.     Left Ear: Tympanic membrane, ear canal and external ear normal.     Nose: Nose normal. No mucosal edema or rhinorrhea.     Mouth/Throat:     Pharynx: Uvula midline. No oropharyngeal exudate.  Eyes:     Conjunctiva/sclera: Conjunctivae normal.  Neck:     Thyroid: No thyromegaly.     Trachea: Trachea normal. No tracheal tenderness or tracheal deviation.  Cardiovascular:     Rate and Rhythm: Normal rate and regular rhythm.     Heart sounds: Normal heart sounds, S1 normal and S2 normal. No murmur heard. Pulmonary:     Effort: No respiratory distress.     Breath sounds: Normal breath sounds. No stridor. No wheezing or rales.  Lymphadenopathy:     Head:     Right side of head: No tonsillar adenopathy.     Left side of head: No tonsillar adenopathy.     Cervical: No cervical adenopathy.  Skin:    Findings: No erythema or rash.     Nails: There is no clubbing.  Neurological:     Mental Status: He is alert.     Diagnostics: Allergy skin tests were performed.   Assessment and Plan:    1. Allergic rhinitis, unspecified seasonality, unspecified trigger     Patient Instructions   1. Continue Immunotherapy (& Epi-Pen)  2. If needed:   Albuterol + Budesonide 0.5 mg - nebulize every 4-6 hours  3. Have you been checked for alpha-1 antitrypsin deficiency ?  4. Return to clinic in 1 year or earlier if problem   Jiles Prows, MD Allergy / Immunology [facility]

## 2022-06-01 NOTE — Patient Instructions (Addendum)
  1. Continue Immunotherapy (& Epi-Pen)  2. If needed:   Albuterol + Budesonide 0.5 mg - nebulize every 4-6 hours  3. Have you been checked for alpha-1 antitrypsin deficiency ?  4. Return to clinic in 1 year or earlier if problem

## 2022-06-01 NOTE — Progress Notes (Unsigned)
Immunotherapy   Patient Details  Name: Jonathan Soto MRN: [id number] Date of Birth: 1950/08/05  06/01/2022  Jonathan Soto transferred ITX here from [facility] Following schedule: B  Frequency:1 time per week Epi-Pen:Epi-Pen Available  Consent signed and patient instructions given.   Glendell Docker 06/01/2022, 9:32 AM

## 2022-06-02 ENCOUNTER — Encounter: Payer: Self-pay | Admitting: Allergy and Immunology

## 2022-06-02 ENCOUNTER — Telehealth: Payer: Self-pay

## 2022-06-02 DIAGNOSIS — Z825 Family history of asthma and other chronic lower respiratory diseases: Secondary | ICD-10-CM

## 2022-06-02 DIAGNOSIS — J4520 Mild intermittent asthma, uncomplicated: Secondary | ICD-10-CM

## 2022-06-02 NOTE — Telephone Encounter (Signed)
-----   Message from Jiles Prows, MD sent at 06/02/2022  7:06 AM EST ----- Please inform Jonathan Soto that I did look at his previous CT scans and I searched for the blood test that I mentioned during his visit and I cannot find that result.  I think it would be best for him to obtain alpha-1 antitrypsin level and phenotype and please get that arranged for him this week.

## 2022-06-02 NOTE — Addendum Note (Signed)
Addended by: Zandra Abts on: 06/02/2022 02:04 PM   Modules accepted: Orders

## 2022-06-02 NOTE — Telephone Encounter (Signed)
Called and informed patient of Dr. Bruna Potter message. I have placed [facility] order via Epic and patient is planning to run by [facility] to have test drawn.

## 2022-06-09 ENCOUNTER — Ambulatory Visit (INDEPENDENT_AMBULATORY_CARE_PROVIDER_SITE_OTHER): Payer: Medicare Other | Admitting: *Deleted

## 2022-06-09 DIAGNOSIS — J309 Allergic rhinitis, unspecified: Secondary | ICD-10-CM

## 2022-06-15 ENCOUNTER — Ambulatory Visit (INDEPENDENT_AMBULATORY_CARE_PROVIDER_SITE_OTHER): Payer: Medicare Other | Admitting: *Deleted

## 2022-06-15 DIAGNOSIS — J309 Allergic rhinitis, unspecified: Secondary | ICD-10-CM | POA: Diagnosis not present

## 2022-06-17 LAB — ALPHA-1-ANTITRYPSIN PHENOTYP: A-1 Antitrypsin: 128 mg/dL (ref 101–187)

## 2022-06-23 ENCOUNTER — Ambulatory Visit (INDEPENDENT_AMBULATORY_CARE_PROVIDER_SITE_OTHER): Payer: Medicare Other

## 2022-06-23 DIAGNOSIS — J309 Allergic rhinitis, unspecified: Secondary | ICD-10-CM

## 2022-06-30 ENCOUNTER — Ambulatory Visit (INDEPENDENT_AMBULATORY_CARE_PROVIDER_SITE_OTHER): Payer: Medicare Other | Admitting: *Deleted

## 2022-06-30 DIAGNOSIS — J309 Allergic rhinitis, unspecified: Secondary | ICD-10-CM

## 2022-07-07 ENCOUNTER — Ambulatory Visit (INDEPENDENT_AMBULATORY_CARE_PROVIDER_SITE_OTHER): Payer: Medicare Other | Admitting: *Deleted

## 2022-07-07 DIAGNOSIS — J309 Allergic rhinitis, unspecified: Secondary | ICD-10-CM

## 2022-07-11 ENCOUNTER — Encounter: Payer: Self-pay | Admitting: Internal Medicine

## 2022-07-11 ENCOUNTER — Ambulatory Visit: Payer: Medicare Other | Admitting: Internal Medicine

## 2022-07-11 VITALS — BP 142/82 | HR 73 | Temp 98.3°F | Resp 18 | Ht 73.0 in | Wt 231.4 lb

## 2022-07-11 DIAGNOSIS — I10 Essential (primary) hypertension: Secondary | ICD-10-CM | POA: Diagnosis not present

## 2022-07-11 DIAGNOSIS — J4520 Mild intermittent asthma, uncomplicated: Secondary | ICD-10-CM | POA: Diagnosis not present

## 2022-07-11 DIAGNOSIS — E7800 Pure hypercholesterolemia, unspecified: Secondary | ICD-10-CM

## 2022-07-11 DIAGNOSIS — R7303 Prediabetes: Secondary | ICD-10-CM | POA: Diagnosis not present

## 2022-07-11 NOTE — Assessment & Plan Note (Signed)
Blood pressure is slightly elevated

## 2022-07-11 NOTE — Assessment & Plan Note (Signed)
Last HbA1c was 6.0% last year and he is due today.

## 2022-07-11 NOTE — Assessment & Plan Note (Signed)
He is getting allergy shots and says his asthma has not bothered him

## 2022-07-11 NOTE — Assessment & Plan Note (Signed)
Will do lipid panel today. 

## 2022-07-11 NOTE — Progress Notes (Signed)
   Office Visit  Subjective   Patient ID: Jonathan Soto   DOB: 08-Oct-1950   Age: 72 y.o.   MRN: [id number]   Chief Complaint Chief Complaint  Patient presents with   Follow-up    3 month follow up      History of Present Illness 72 years old male who is here for follow-up.  He is getting allergy shots once a week and his allergies are much better.  He also has asthma and says that he did not have any asthma attack this year. He has hypertension and take blood pressure medications.  His blood pressure is slightly high today but he says that he ate something last night that did not agree with him and he could not sleep last night.  He has a blood pressure monitor at home and he will keep an eye on it. He also has borderline diabetes and his hemoglobin A1c was 6.0 last year.Marland Kitchen He is also due for his lipid panel today.  Past Medical History Past Medical History:  Diagnosis Date   Asthma    Hyperlipidemia    Hypertension      Allergies No Known Allergies   Review of Systems Review of Systems  Constitutional: Negative.   HENT: Negative.    Respiratory: Negative.    Cardiovascular: Negative.   Gastrointestinal: Negative.   Neurological: Negative.        Objective:    Vitals BP (!) 142/82 (BP Location: Left Arm, Patient Position: Sitting, Cuff Size: Normal)   Pulse 73   Temp 98.3 F (36.8 C)   Resp 18   Ht 6\' 1"  (1.854 m)   Wt 231 lb 6 oz (105 kg)   SpO2 93%   BMI 30.53 kg/m    Physical Examination Physical Exam Constitutional:      Appearance: Normal appearance. He is normal weight.  HENT:     Head: Normocephalic and atraumatic.  Eyes:     Extraocular Movements: Extraocular movements intact.     Pupils: Pupils are equal, round, and reactive to light.  Cardiovascular:     Rate and Rhythm: Normal rate and regular rhythm.     Heart sounds: Normal heart sounds.  Pulmonary:     Effort: Pulmonary effort is normal.     Breath sounds: Normal breath sounds.   Abdominal:     General: Bowel sounds are normal.     Palpations: Abdomen is soft.  Neurological:     General: No focal deficit present.     Mental Status: He is alert and oriented to person, place, and time.        Assessment & Plan:   Essential hypertension Blood pressure is slightly elevated  Borderline type 2 diabetes mellitus Last HbA1c was 6.0% last year and he is due today.   Hypercholesterolemia Will do lipid panel today.  Asthma He is getting allergy shots and says his asthma has not bothered him     Return in about 3 months (around 10/11/2022).   Garwin Brothers, MD

## 2022-07-11 NOTE — Patient Instructions (Signed)
Will monitor blood pressure at home and make sure that it stays below 130/80.

## 2022-07-12 LAB — CMP14 + ANION GAP
ALT: 22 IU/L (ref 0–44)
AST: 19 IU/L (ref 0–40)
Albumin/Globulin Ratio: 1.7 (ref 1.2–2.2)
Albumin: 4.1 g/dL (ref 3.8–4.8)
Alkaline Phosphatase: 69 IU/L (ref 44–121)
Anion Gap: 15 mmol/L (ref 10.0–18.0)
BUN/Creatinine Ratio: 20 (ref 10–24)
BUN: 15 mg/dL (ref 8–27)
Bilirubin Total: 0.2 mg/dL (ref 0.0–1.2)
CO2: 25 mmol/L (ref 20–29)
Calcium: 9.5 mg/dL (ref 8.6–10.2)
Chloride: 103 mmol/L (ref 96–106)
Creatinine, Ser: 0.75 mg/dL — ABNORMAL LOW (ref 0.76–1.27)
Globulin, Total: 2.4 g/dL (ref 1.5–4.5)
Glucose: 133 mg/dL — ABNORMAL HIGH (ref 70–99)
Potassium: 4.4 mmol/L (ref 3.5–5.2)
Sodium: 143 mmol/L (ref 134–144)
Total Protein: 6.5 g/dL (ref 6.0–8.5)
eGFR: 95 mL/min/{1.73_m2} (ref >59)

## 2022-07-12 LAB — LIPID PANEL
Chol/HDL Ratio: 3.3 ratio (ref 0.0–5.0)
Cholesterol, Total: 137 mg/dL (ref 100–199)
HDL: 42 mg/dL (ref >39)
LDL Chol Calc (NIH): 61 mg/dL (ref 0–99)
Triglycerides: 210 mg/dL — ABNORMAL HIGH (ref 0–149)
VLDL Cholesterol Cal: 34 mg/dL (ref 5–40)

## 2022-07-12 LAB — HEMOGLOBIN A1C
Est. average glucose Bld gHb Est-mCnc: 126 mg/dL
Hgb A1c MFr Bld: 6 % — ABNORMAL HIGH (ref 4.8–5.6)

## 2022-07-13 NOTE — Progress Notes (Signed)
Patient called.  Patient aware.   Jonathan Soto  His labs are much better

## 2022-07-14 ENCOUNTER — Ambulatory Visit (INDEPENDENT_AMBULATORY_CARE_PROVIDER_SITE_OTHER): Payer: Medicare Other

## 2022-07-14 DIAGNOSIS — J309 Allergic rhinitis, unspecified: Secondary | ICD-10-CM | POA: Diagnosis not present

## 2022-07-21 ENCOUNTER — Ambulatory Visit (INDEPENDENT_AMBULATORY_CARE_PROVIDER_SITE_OTHER): Payer: Medicare Other | Admitting: *Deleted

## 2022-07-21 DIAGNOSIS — J309 Allergic rhinitis, unspecified: Secondary | ICD-10-CM

## 2022-07-28 ENCOUNTER — Ambulatory Visit (INDEPENDENT_AMBULATORY_CARE_PROVIDER_SITE_OTHER): Payer: Medicare Other

## 2022-07-28 DIAGNOSIS — J309 Allergic rhinitis, unspecified: Secondary | ICD-10-CM

## 2022-08-04 ENCOUNTER — Ambulatory Visit (INDEPENDENT_AMBULATORY_CARE_PROVIDER_SITE_OTHER): Payer: Medicare Other | Admitting: *Deleted

## 2022-08-04 DIAGNOSIS — J309 Allergic rhinitis, unspecified: Secondary | ICD-10-CM

## 2022-08-11 ENCOUNTER — Ambulatory Visit (INDEPENDENT_AMBULATORY_CARE_PROVIDER_SITE_OTHER): Payer: Medicare Other | Admitting: *Deleted

## 2022-08-11 DIAGNOSIS — J309 Allergic rhinitis, unspecified: Secondary | ICD-10-CM

## 2022-08-18 ENCOUNTER — Ambulatory Visit (INDEPENDENT_AMBULATORY_CARE_PROVIDER_SITE_OTHER): Payer: Medicare Other | Admitting: *Deleted

## 2022-08-18 DIAGNOSIS — J309 Allergic rhinitis, unspecified: Secondary | ICD-10-CM

## 2022-08-25 ENCOUNTER — Ambulatory Visit (INDEPENDENT_AMBULATORY_CARE_PROVIDER_SITE_OTHER): Payer: Medicare Other | Admitting: *Deleted

## 2022-08-25 DIAGNOSIS — J309 Allergic rhinitis, unspecified: Secondary | ICD-10-CM | POA: Diagnosis not present

## 2022-09-01 ENCOUNTER — Ambulatory Visit (INDEPENDENT_AMBULATORY_CARE_PROVIDER_SITE_OTHER): Payer: Medicare Other | Admitting: *Deleted

## 2022-09-01 DIAGNOSIS — J309 Allergic rhinitis, unspecified: Secondary | ICD-10-CM | POA: Diagnosis not present

## 2022-09-08 ENCOUNTER — Ambulatory Visit (INDEPENDENT_AMBULATORY_CARE_PROVIDER_SITE_OTHER): Payer: Medicare Other | Admitting: *Deleted

## 2022-09-08 DIAGNOSIS — J309 Allergic rhinitis, unspecified: Secondary | ICD-10-CM | POA: Diagnosis not present

## 2022-09-15 ENCOUNTER — Ambulatory Visit (INDEPENDENT_AMBULATORY_CARE_PROVIDER_SITE_OTHER): Payer: Medicare Other | Admitting: *Deleted

## 2022-09-15 DIAGNOSIS — J309 Allergic rhinitis, unspecified: Secondary | ICD-10-CM | POA: Diagnosis not present

## 2022-09-22 ENCOUNTER — Ambulatory Visit (INDEPENDENT_AMBULATORY_CARE_PROVIDER_SITE_OTHER): Payer: Medicare Other | Admitting: *Deleted

## 2022-09-22 DIAGNOSIS — J309 Allergic rhinitis, unspecified: Secondary | ICD-10-CM

## 2022-09-29 ENCOUNTER — Ambulatory Visit (INDEPENDENT_AMBULATORY_CARE_PROVIDER_SITE_OTHER): Payer: Medicare Other | Admitting: *Deleted

## 2022-09-29 DIAGNOSIS — J309 Allergic rhinitis, unspecified: Secondary | ICD-10-CM

## 2022-10-06 ENCOUNTER — Ambulatory Visit (INDEPENDENT_AMBULATORY_CARE_PROVIDER_SITE_OTHER): Payer: Medicare Other | Admitting: *Deleted

## 2022-10-06 DIAGNOSIS — J309 Allergic rhinitis, unspecified: Secondary | ICD-10-CM | POA: Diagnosis not present

## 2022-10-10 ENCOUNTER — Ambulatory Visit: Payer: Medicare Other | Admitting: Internal Medicine

## 2022-10-12 ENCOUNTER — Ambulatory Visit: Payer: Medicare Other | Admitting: Student

## 2022-10-12 ENCOUNTER — Other Ambulatory Visit: Payer: Self-pay

## 2022-10-12 ENCOUNTER — Encounter: Payer: Self-pay | Admitting: Student

## 2022-10-12 VITALS — BP 130/68 | HR 76 | Temp 98.4°F | Resp 18 | Ht 73.0 in | Wt 238.1 lb

## 2022-10-12 DIAGNOSIS — I10 Essential (primary) hypertension: Secondary | ICD-10-CM

## 2022-10-12 DIAGNOSIS — J4520 Mild intermittent asthma, uncomplicated: Secondary | ICD-10-CM | POA: Diagnosis not present

## 2022-10-12 DIAGNOSIS — E7800 Pure hypercholesterolemia, unspecified: Secondary | ICD-10-CM | POA: Diagnosis not present

## 2022-10-12 DIAGNOSIS — R7303 Prediabetes: Secondary | ICD-10-CM | POA: Diagnosis not present

## 2022-10-12 MED ORDER — POTASSIUM CHLORIDE CRYS ER 20 MEQ PO TBCR: EXTENDED_RELEASE_TABLET | ORAL | 1 refills | Status: DC

## 2022-10-12 MED ORDER — POTASSIUM CHLORIDE CRYS ER 20 MEQ PO TBCR: 20.0000 meq | EXTENDED_RELEASE_TABLET | Freq: Once | ORAL | 1 refills | Status: DC

## 2022-10-12 MED ORDER — FUROSEMIDE 40 MG PO TABS: 40.0000 mg | ORAL_TABLET | Freq: Every day | ORAL | 1 refills | Status: DC | PRN

## 2022-10-12 NOTE — Assessment & Plan Note (Signed)
Blood tests are normal cholesterol level continues to trend down. He will continue to monitor diet.

## 2022-10-12 NOTE — Assessment & Plan Note (Signed)
A1c today is 6.0 and continues to decrease. This is great! We will continue to monitor.

## 2022-10-12 NOTE — Addendum Note (Signed)
Addended by: Edwena Blow on: 10/12/2022 10:54 AM   Modules accepted: Level of Service

## 2022-10-12 NOTE — Assessment & Plan Note (Signed)
Blood pressures continue to be in range.  For the leg swelling we will use Lasix 40 mg daily as needed along with potassium 20 mg.  The patient is aware of how to space medications out.  He will return in 3 months for follow-up with fasting labs.

## 2022-10-12 NOTE — Assessment & Plan Note (Signed)
His asthma symptoms are controlled. He will continue to follow allergy specialist.

## 2022-10-12 NOTE — Progress Notes (Addendum)
Office Visit  Subjective   Patient ID: Jonathan Soto   DOB: 1950/05/28   Age: 72 y.o.   MRN: [id number]   Chief Complaint Chief Complaint  Patient presents with   Follow-up    Essential hypertension     History of Present Illness Mr. Habetz a 72 year old male who is here for a 3 month follow-up. His hypertension is being manage with medications and he reports checking his blood pressure daily with readings from 1 128-130 systolic.   We have reviewed his recent laboratory results which indicated an A1c of 6.0 along with previous lipid panel.  He is watching what he eats and is limiting salt and greasy foods.   He continues to get allergy shots once a week for his allergies and reports that he feels much better.  This year he has not had an asthma attack and remains aware of triggers.  He completed a sleep study last year but did not receive results.  Today he denies shortness of breath or chest pain but endorses intermittent leg swelling this summer.  He is requesting a diuretic to assist with swelling as needed.     Past Medical History Past Medical History:  Diagnosis Date   Asthma    Hyperlipidemia    Hypertension      Allergies No Known Allergies   Medications  Current Outpatient Medications:    amLODipine (NORVASC) 5 MG tablet, Take 5 mg by mouth daily., Disp: , Rfl:    furosemide (LASIX) 40 MG tablet, Take 1 tablet (40 mg total) by mouth daily as needed., Disp: 30 tablet, Rfl: 1   losartan (COZAAR) 50 MG tablet, , Disp: , Rfl:    potassium chloride SA (KLOR-CON M) 20 MEQ tablet, Take 1 tablet (20 mEq total) by mouth once for 1 dose., Disp: 30 tablet, Rfl: 1   Review of Systems Review of Systems  Constitutional:  Negative for chills, fever and malaise/fatigue.  HENT: Negative.    Respiratory: Negative.    Cardiovascular:  Positive for leg swelling. Negative for chest pain, palpitations and orthopnea.  Gastrointestinal: Negative.   Genitourinary: Negative.    Musculoskeletal: Negative.   Skin: Negative.   Neurological: Negative.   Endo/Heme/Allergies:  Positive for environmental allergies.  Psychiatric/Behavioral: Negative.         Objective:    Vitals BP 130/68 (BP Location: Left Arm, Patient Position: Sitting, Cuff Size: Normal)   Pulse 76   Temp 98.4 F (36.9 C)   Resp 18   Ht 6\' 1"  (1.854 m)   Wt 238 lb 2 oz (108 kg)   SpO2 92%   BMI 31.42 kg/m    Physical Examination Physical Exam Constitutional:      Appearance: Normal appearance.  Cardiovascular:     Rate and Rhythm: Normal rate and regular rhythm.     Pulses: Normal pulses.     Heart sounds: Normal heart sounds. No murmur heard. Pulmonary:     Effort: Pulmonary effort is normal.     Breath sounds: Normal breath sounds. No wheezing, rhonchi or rales.  Chest:     Chest wall: No tenderness.  Abdominal:     General: Bowel sounds are normal. There is no distension.     Tenderness: There is no abdominal tenderness.  Musculoskeletal:        General: Normal range of motion.     Right lower leg: No edema.     Left lower leg: No edema.  Skin:  General: Skin is warm and dry.     Capillary Refill: Capillary refill takes less than 2 seconds.  Neurological:     Mental Status: He is alert and oriented to person, place, and time.  Psychiatric:        Mood and Affect: Mood normal.        Behavior: Behavior normal.        Thought Content: Thought content normal.        Assessment & Plan:   Essential hypertension Blood pressures continue to be in range.  For the leg swelling we will use Lasix 40 mg daily as needed along with potassium 20 mg.  The patient is aware of how to space medications out.  He will return in 3 months for follow-up with fasting labs.  Asthma His asthma symptoms are controlled. He will continue to follow allergy specialist.  Hypercholesterolemia Blood tests are normal cholesterol level continues to trend down. He will continue to monitor  diet.   Borderline type 2 diabetes mellitus A1c today is 6.0 and continues to decrease. This is great! We will continue to monitor.     Return in about 3 months (around 01/12/2023) for f/u with fasting labs.   Edwena Blow, NP

## 2022-10-13 ENCOUNTER — Ambulatory Visit (INDEPENDENT_AMBULATORY_CARE_PROVIDER_SITE_OTHER): Payer: Medicare Other | Admitting: *Deleted

## 2022-10-13 DIAGNOSIS — J309 Allergic rhinitis, unspecified: Secondary | ICD-10-CM | POA: Diagnosis not present

## 2022-10-20 ENCOUNTER — Ambulatory Visit (INDEPENDENT_AMBULATORY_CARE_PROVIDER_SITE_OTHER): Payer: Medicare Other | Admitting: *Deleted

## 2022-10-20 DIAGNOSIS — J309 Allergic rhinitis, unspecified: Secondary | ICD-10-CM

## 2022-10-31 ENCOUNTER — Ambulatory Visit (INDEPENDENT_AMBULATORY_CARE_PROVIDER_SITE_OTHER): Payer: Medicare Other | Admitting: *Deleted

## 2022-10-31 DIAGNOSIS — J309 Allergic rhinitis, unspecified: Secondary | ICD-10-CM | POA: Diagnosis not present

## 2022-11-09 ENCOUNTER — Ambulatory Visit (INDEPENDENT_AMBULATORY_CARE_PROVIDER_SITE_OTHER): Payer: Medicare Other | Admitting: *Deleted

## 2022-11-09 DIAGNOSIS — J309 Allergic rhinitis, unspecified: Secondary | ICD-10-CM

## 2022-11-16 ENCOUNTER — Ambulatory Visit (INDEPENDENT_AMBULATORY_CARE_PROVIDER_SITE_OTHER): Payer: Medicare Other | Admitting: *Deleted

## 2022-11-16 DIAGNOSIS — J309 Allergic rhinitis, unspecified: Secondary | ICD-10-CM

## 2022-11-24 ENCOUNTER — Ambulatory Visit (INDEPENDENT_AMBULATORY_CARE_PROVIDER_SITE_OTHER): Payer: Medicare Other | Admitting: *Deleted

## 2022-11-24 DIAGNOSIS — J309 Allergic rhinitis, unspecified: Secondary | ICD-10-CM

## 2022-11-30 ENCOUNTER — Ambulatory Visit (INDEPENDENT_AMBULATORY_CARE_PROVIDER_SITE_OTHER): Payer: Medicare Other | Admitting: *Deleted

## 2022-11-30 DIAGNOSIS — J309 Allergic rhinitis, unspecified: Secondary | ICD-10-CM | POA: Diagnosis not present

## 2022-12-08 ENCOUNTER — Ambulatory Visit (INDEPENDENT_AMBULATORY_CARE_PROVIDER_SITE_OTHER): Payer: Medicare Other | Admitting: *Deleted

## 2022-12-08 DIAGNOSIS — J309 Allergic rhinitis, unspecified: Secondary | ICD-10-CM

## 2022-12-15 ENCOUNTER — Ambulatory Visit (INDEPENDENT_AMBULATORY_CARE_PROVIDER_SITE_OTHER): Payer: Medicare Other | Admitting: *Deleted

## 2022-12-15 DIAGNOSIS — J309 Allergic rhinitis, unspecified: Secondary | ICD-10-CM | POA: Diagnosis not present

## 2022-12-22 ENCOUNTER — Ambulatory Visit (INDEPENDENT_AMBULATORY_CARE_PROVIDER_SITE_OTHER): Payer: Medicare Other | Admitting: *Deleted

## 2022-12-22 DIAGNOSIS — J309 Allergic rhinitis, unspecified: Secondary | ICD-10-CM

## 2022-12-29 ENCOUNTER — Ambulatory Visit (INDEPENDENT_AMBULATORY_CARE_PROVIDER_SITE_OTHER): Payer: Medicare Other

## 2022-12-29 DIAGNOSIS — J309 Allergic rhinitis, unspecified: Secondary | ICD-10-CM

## 2023-01-05 ENCOUNTER — Ambulatory Visit (INDEPENDENT_AMBULATORY_CARE_PROVIDER_SITE_OTHER): Payer: Medicare Other | Admitting: *Deleted

## 2023-01-05 DIAGNOSIS — J309 Allergic rhinitis, unspecified: Secondary | ICD-10-CM

## 2023-01-12 ENCOUNTER — Ambulatory Visit (INDEPENDENT_AMBULATORY_CARE_PROVIDER_SITE_OTHER): Payer: Medicare Other

## 2023-01-12 DIAGNOSIS — J309 Allergic rhinitis, unspecified: Secondary | ICD-10-CM

## 2023-01-13 ENCOUNTER — Ambulatory Visit: Payer: Medicare Other | Admitting: Internal Medicine

## 2023-01-13 ENCOUNTER — Encounter: Payer: Self-pay | Admitting: Internal Medicine

## 2023-01-13 VITALS — BP 122/70 | HR 63 | Temp 98.2°F | Resp 18 | Ht 73.0 in | Wt 227.4 lb

## 2023-01-13 DIAGNOSIS — J4520 Mild intermittent asthma, uncomplicated: Secondary | ICD-10-CM

## 2023-01-13 DIAGNOSIS — I10 Essential (primary) hypertension: Secondary | ICD-10-CM

## 2023-01-13 DIAGNOSIS — R7303 Prediabetes: Secondary | ICD-10-CM | POA: Diagnosis not present

## 2023-01-13 DIAGNOSIS — E7800 Pure hypercholesterolemia, unspecified: Secondary | ICD-10-CM

## 2023-01-13 NOTE — Assessment & Plan Note (Signed)
Will do labs on next visit.

## 2023-01-13 NOTE — Assessment & Plan Note (Signed)
controlled 

## 2023-01-13 NOTE — Assessment & Plan Note (Signed)
Well controlled

## 2023-01-13 NOTE — Assessment & Plan Note (Signed)
He is watching his diet, he has lost 3 more pounds.

## 2023-01-13 NOTE — Progress Notes (Signed)
Office Visit  Subjective   Patient ID: Jonathan Soto   DOB: [birth year]   Age: 72 y.o.   MRN: [id number]   Chief Complaint Chief Complaint  Patient presents with   Follow-up    Essential Hypertension     History of Present Illness 72 years old male who is here for follow-up.  He is getting allergy shots once a week and his allergies are much better.  He also has asthma and says that he did not have any asthma attack this year. He has hypertension and take blood pressure medications.  His blood pressure is slightly high today but he says that he ate something last night that did not agree with him and he could not sleep last night.  He has a blood pressure monitor at home and he will keep an eye on it. He also has borderline diabetes and his hemoglobin A1c was 6.0 last year.Marland Kitchen He is also due for his lipid panel today.  Past Medical History Past Medical History:  Diagnosis Date   Asthma    Hyperlipidemia    Hypertension      Allergies No Known Allergies   Review of Systems Review of Systems  Constitutional: Negative.   HENT: Negative.    Respiratory: Negative.    Cardiovascular: Negative.   Gastrointestinal: Negative.   Neurological: Negative.        Objective:    Vitals BP 122/70 (BP Location: Left Arm, Patient Position: Sitting, Cuff Size: Normal)   Pulse 63   Temp 98.2 F (36.8 C)   Resp 18   Ht 6\' 1"  (1.854 m)   Wt 227 lb 6 oz (103.1 kg)   SpO2 99%   BMI 30.00 kg/m    Physical Examination Physical Exam Constitutional:      Appearance: Normal appearance. He is normal weight.  HENT:     Head: Normocephalic and atraumatic.  Eyes:     Extraocular Movements: Extraocular movements intact.     Pupils: Pupils are equal, round, and reactive to light.  Cardiovascular:     Rate and Rhythm: Normal rate and regular rhythm.     Heart sounds: Normal heart sounds.  Pulmonary:     Effort: Pulmonary effort is normal.     Breath sounds: Normal breath sounds.   Abdominal:     General: Bowel sounds are normal.     Palpations: Abdomen is soft.  Neurological:     General: No focal deficit present.     Mental Status: He is alert and oriented to person, place, and time.        Assessment & Plan:   Essential hypertension controlled  Borderline type 2 diabetes mellitus He is watching his diet, he has lost 3 more pounds.   Asthma Well controlled.   Hypercholesterolemia Will do labs on next visit    Return in about 3 months (around 04/14/2023).   Eloisa Northern, MD

## 2023-01-19 ENCOUNTER — Ambulatory Visit (INDEPENDENT_AMBULATORY_CARE_PROVIDER_SITE_OTHER): Payer: Medicare Other | Admitting: *Deleted

## 2023-01-19 DIAGNOSIS — J309 Allergic rhinitis, unspecified: Secondary | ICD-10-CM | POA: Diagnosis not present

## 2023-01-26 ENCOUNTER — Ambulatory Visit (INDEPENDENT_AMBULATORY_CARE_PROVIDER_SITE_OTHER): Payer: Medicare Other

## 2023-01-26 DIAGNOSIS — J309 Allergic rhinitis, unspecified: Secondary | ICD-10-CM | POA: Diagnosis not present

## 2023-02-02 ENCOUNTER — Ambulatory Visit (INDEPENDENT_AMBULATORY_CARE_PROVIDER_SITE_OTHER): Payer: Medicare Other

## 2023-02-02 DIAGNOSIS — J309 Allergic rhinitis, unspecified: Secondary | ICD-10-CM | POA: Diagnosis not present

## 2023-02-08 ENCOUNTER — Other Ambulatory Visit: Payer: Self-pay

## 2023-02-08 ENCOUNTER — Other Ambulatory Visit: Payer: Self-pay | Admitting: Internal Medicine

## 2023-02-08 ENCOUNTER — Ambulatory Visit (INDEPENDENT_AMBULATORY_CARE_PROVIDER_SITE_OTHER): Payer: Medicare Other | Admitting: *Deleted

## 2023-02-08 DIAGNOSIS — J309 Allergic rhinitis, unspecified: Secondary | ICD-10-CM | POA: Diagnosis not present

## 2023-02-09 ENCOUNTER — Other Ambulatory Visit: Payer: Self-pay

## 2023-02-16 ENCOUNTER — Ambulatory Visit (INDEPENDENT_AMBULATORY_CARE_PROVIDER_SITE_OTHER): Payer: Medicare Other | Admitting: *Deleted

## 2023-02-16 DIAGNOSIS — J309 Allergic rhinitis, unspecified: Secondary | ICD-10-CM

## 2023-02-23 ENCOUNTER — Ambulatory Visit (INDEPENDENT_AMBULATORY_CARE_PROVIDER_SITE_OTHER): Payer: Medicare Other | Admitting: *Deleted

## 2023-02-23 DIAGNOSIS — J309 Allergic rhinitis, unspecified: Secondary | ICD-10-CM | POA: Diagnosis not present

## 2023-03-02 ENCOUNTER — Ambulatory Visit (INDEPENDENT_AMBULATORY_CARE_PROVIDER_SITE_OTHER): Payer: Medicare Other | Admitting: *Deleted

## 2023-03-02 DIAGNOSIS — J309 Allergic rhinitis, unspecified: Secondary | ICD-10-CM | POA: Diagnosis not present

## 2023-03-20 ENCOUNTER — Ambulatory Visit (INDEPENDENT_AMBULATORY_CARE_PROVIDER_SITE_OTHER): Payer: Medicare Other | Admitting: *Deleted

## 2023-03-20 DIAGNOSIS — J309 Allergic rhinitis, unspecified: Secondary | ICD-10-CM

## 2023-03-28 ENCOUNTER — Ambulatory Visit (INDEPENDENT_AMBULATORY_CARE_PROVIDER_SITE_OTHER): Payer: Medicare Other

## 2023-03-28 DIAGNOSIS — J309 Allergic rhinitis, unspecified: Secondary | ICD-10-CM | POA: Diagnosis not present

## 2023-04-04 ENCOUNTER — Ambulatory Visit (INDEPENDENT_AMBULATORY_CARE_PROVIDER_SITE_OTHER): Payer: Medicare Other

## 2023-04-04 DIAGNOSIS — J309 Allergic rhinitis, unspecified: Secondary | ICD-10-CM

## 2023-04-10 ENCOUNTER — Ambulatory Visit (INDEPENDENT_AMBULATORY_CARE_PROVIDER_SITE_OTHER): Payer: Medicare Other | Admitting: *Deleted

## 2023-04-10 DIAGNOSIS — J309 Allergic rhinitis, unspecified: Secondary | ICD-10-CM

## 2023-04-17 ENCOUNTER — Ambulatory Visit: Payer: Medicare Other | Admitting: Internal Medicine

## 2023-04-17 VITALS — BP 130/78 | HR 62 | Temp 98.2°F | Resp 18 | Ht 73.0 in | Wt 235.1 lb

## 2023-04-17 DIAGNOSIS — J4520 Mild intermittent asthma, uncomplicated: Secondary | ICD-10-CM

## 2023-04-17 DIAGNOSIS — R7303 Prediabetes: Secondary | ICD-10-CM

## 2023-04-17 DIAGNOSIS — I10 Essential (primary) hypertension: Secondary | ICD-10-CM

## 2023-04-17 DIAGNOSIS — E7800 Pure hypercholesterolemia, unspecified: Secondary | ICD-10-CM | POA: Diagnosis not present

## 2023-04-17 DIAGNOSIS — Z125 Encounter for screening for malignant neoplasm of prostate: Secondary | ICD-10-CM

## 2023-04-17 DIAGNOSIS — Z0000 Encounter for general adult medical examination without abnormal findings: Secondary | ICD-10-CM | POA: Diagnosis not present

## 2023-04-17 DIAGNOSIS — Z6831 Body mass index (BMI) 31.0-31.9, adult: Secondary | ICD-10-CM | POA: Diagnosis not present

## 2023-04-17 NOTE — Assessment & Plan Note (Signed)
He will continue to watch his diet and he is fasting for lipid panel today.

## 2023-04-17 NOTE — Assessment & Plan Note (Signed)
Will give him flu shot.  Rest of screenings were reviewed including  mini-mental status examination.

## 2023-04-17 NOTE — Progress Notes (Addendum)
   Office Visit  Subjective   Patient ID: Jonathan Soto   DOB: 1950/07/01   Age: 72 y.o.   MRN: [id number]   Chief Complaint Chief Complaint  Patient presents with   Annual Exam    Annual wellness visit     History of Present Illness 72 years old male is here for annual wellness examination. He is retired, he is married, he does not smoke and does not drink.   She get flu shot every year, he missed this year. He has pneumonia and shingle vaccine. He never has RSV vaccine. He has COVID vaccine and booster. His last tetanus shot was less than 10 year ago.   He has colonoscopy in 2023 by Dr. Chales Abrahams and no polyps were removed but he has hemorrhoids. He denies any difficulty in urination.   He denies any fall. He is independent in all ADL. He denies any complaint.   He get weekly allergies shot from allergist.  He has hypertension and take amlodipine 5 mg daily. blood pressure medications. His blood pressure is slightly high today but he says that he ate something last night that did not agree with him and he could not sleep last night.  He has a blood pressure monitor at home and he will keep an eye on it. He also has borderline diabetes and his hemoglobin A1c was 6.0 last year.Marland Kitchen He is also due for his lipid panel today.  Past Medical History Past Medical History:  Diagnosis Date   Asthma    Hyperlipidemia    Hypertension      Allergies No Known Allergies   Review of Systems Review of Systems  Constitutional: Negative.   HENT: Negative.    Respiratory: Negative.    Cardiovascular: Negative.   Gastrointestinal: Negative.   Neurological: Negative.        Objective:    Vitals BP 130/78 (BP Location: Left Arm, Patient Position: Sitting, Cuff Size: Normal)   Pulse 62   Temp 98.2 F (36.8 C)   Resp 18   Ht 6\' 1"  (1.854 m)   Wt 235 lb 2 oz (106.7 kg)   SpO2 92%   BMI 31.02 kg/m    Physical Examination Physical Exam Constitutional:      Appearance: Normal  appearance.  HENT:     Head: Normocephalic and atraumatic.  Cardiovascular:     Rate and Rhythm: Normal rate and regular rhythm.     Heart sounds: Normal heart sounds.  Pulmonary:     Effort: Pulmonary effort is normal.     Breath sounds: Normal breath sounds.  Abdominal:     General: Bowel sounds are normal.     Palpations: Abdomen is soft.  Neurological:     General: No focal deficit present.     Mental Status: He is alert and oriented to person, place, and time.        Assessment & Plan:   Essential hypertension controlled  Asthma   Better since he started taking  Allergy injection.  Annual physical exam  Will give him flu shot.  Rest of screenings were reviewed including  mini-mental status examination.  Borderline type 2 diabetes mellitus   He will continue to watch his diet.  I will repeat Hemoglobin A1c  Hypercholesterolemia  He will continue to watch his diet and he is fasting for lipid panel today.    Return in about 3 months (around 07/16/2023).   Eloisa Northern, MD

## 2023-04-17 NOTE — Assessment & Plan Note (Signed)
He will continue to watch his diet.  I will repeat Hemoglobin A1c

## 2023-04-17 NOTE — Assessment & Plan Note (Signed)
Better since he started taking  Allergy injection.

## 2023-04-17 NOTE — Assessment & Plan Note (Signed)
controlled 

## 2023-04-18 LAB — CMP14 + ANION GAP
ALT: 28 [IU]/L (ref 0–44)
AST: 23 [IU]/L (ref 0–40)
Albumin: 4.2 g/dL (ref 3.8–4.8)
Alkaline Phosphatase: 61 [IU]/L (ref 44–121)
Anion Gap: 13 mmol/L (ref 10.0–18.0)
BUN/Creatinine Ratio: 15 (ref 10–24)
BUN: 13 mg/dL (ref 8–27)
Bilirubin Total: 0.4 mg/dL (ref 0.0–1.2)
CO2: 26 mmol/L (ref 20–29)
Calcium: 9.4 mg/dL (ref 8.6–10.2)
Chloride: 103 mmol/L (ref 96–106)
Creatinine, Ser: 0.84 mg/dL (ref 0.76–1.27)
Globulin, Total: 2.5 g/dL (ref 1.5–4.5)
Glucose: 110 mg/dL — ABNORMAL HIGH (ref 70–99)
Potassium: 4.9 mmol/L (ref 3.5–5.2)
Sodium: 142 mmol/L (ref 134–144)
Total Protein: 6.7 g/dL (ref 6.0–8.5)
eGFR: 92 mL/min/{1.73_m2} (ref >59)

## 2023-04-18 LAB — CBC WITH DIFFERENTIAL/PLATELET
Basophils Absolute: 0 10*3/uL (ref 0.0–0.2)
Basos: 1 %
EOS (ABSOLUTE): 0.2 10*3/uL (ref 0.0–0.4)
Eos: 4 %
Hematocrit: 46.4 % (ref 37.5–51.0)
Hemoglobin: 14.9 g/dL (ref 13.0–17.7)
Immature Grans (Abs): 0 10*3/uL (ref 0.0–0.1)
Immature Granulocytes: 1 %
Lymphocytes Absolute: 2.2 10*3/uL (ref 0.7–3.1)
Lymphs: 33 %
MCH: 29.7 pg (ref 26.6–33.0)
MCHC: 32.1 g/dL (ref 31.5–35.7)
MCV: 93 fL (ref 79–97)
Monocytes Absolute: 0.7 10*3/uL (ref 0.1–0.9)
Monocytes: 11 %
Neutrophils Absolute: 3.3 10*3/uL (ref 1.4–7.0)
Neutrophils: 50 %
Platelets: 226 10*3/uL (ref 150–450)
RBC: 5.01 x10E6/uL (ref 4.14–5.80)
RDW: 13 % (ref 11.6–15.4)
WBC: 6.5 10*3/uL (ref 3.4–10.8)

## 2023-04-18 LAB — LIPID PANEL
Chol/HDL Ratio: 3 {ratio} (ref 0.0–5.0)
Cholesterol, Total: 185 mg/dL (ref 100–199)
HDL: 61 mg/dL (ref >39)
LDL Chol Calc (NIH): 109 mg/dL — ABNORMAL HIGH (ref 0–99)
Triglycerides: 80 mg/dL (ref 0–149)
VLDL Cholesterol Cal: 15 mg/dL (ref 5–40)

## 2023-04-18 LAB — HEMOGLOBIN A1C
Est. average glucose Bld gHb Est-mCnc: 131 mg/dL
Hgb A1c MFr Bld: 6.2 % — ABNORMAL HIGH (ref 4.8–5.6)

## 2023-04-18 LAB — PSA: Prostate Specific Ag, Serum: 1.6 ng/mL (ref 0.0–4.0)

## 2023-04-18 LAB — VITAMIN D 25 HYDROXY (VIT D DEFICIENCY, FRACTURES): Vit D, 25-Hydroxy: 36.2 ng/mL (ref 30.0–100.0)

## 2023-04-24 NOTE — Progress Notes (Signed)
 Patient called.  Patient aware. His labs are ok.

## 2023-04-27 ENCOUNTER — Ambulatory Visit (INDEPENDENT_AMBULATORY_CARE_PROVIDER_SITE_OTHER): Payer: Medicare Other | Admitting: *Deleted

## 2023-04-27 DIAGNOSIS — J309 Allergic rhinitis, unspecified: Secondary | ICD-10-CM | POA: Diagnosis not present

## 2023-05-10 ENCOUNTER — Ambulatory Visit (INDEPENDENT_AMBULATORY_CARE_PROVIDER_SITE_OTHER): Payer: Medicare Other | Admitting: *Deleted

## 2023-05-10 DIAGNOSIS — J309 Allergic rhinitis, unspecified: Secondary | ICD-10-CM

## 2023-05-17 ENCOUNTER — Ambulatory Visit (INDEPENDENT_AMBULATORY_CARE_PROVIDER_SITE_OTHER): Payer: Medicare Other | Admitting: *Deleted

## 2023-05-17 DIAGNOSIS — J309 Allergic rhinitis, unspecified: Secondary | ICD-10-CM | POA: Diagnosis not present

## 2023-05-24 ENCOUNTER — Ambulatory Visit (INDEPENDENT_AMBULATORY_CARE_PROVIDER_SITE_OTHER): Payer: Medicare Other

## 2023-05-24 DIAGNOSIS — J309 Allergic rhinitis, unspecified: Secondary | ICD-10-CM

## 2023-05-31 ENCOUNTER — Ambulatory Visit (INDEPENDENT_AMBULATORY_CARE_PROVIDER_SITE_OTHER): Payer: Medicare Other

## 2023-05-31 DIAGNOSIS — J309 Allergic rhinitis, unspecified: Secondary | ICD-10-CM

## 2023-06-08 ENCOUNTER — Ambulatory Visit (INDEPENDENT_AMBULATORY_CARE_PROVIDER_SITE_OTHER): Payer: Medicare Other | Admitting: *Deleted

## 2023-06-08 DIAGNOSIS — J309 Allergic rhinitis, unspecified: Secondary | ICD-10-CM

## 2023-06-13 ENCOUNTER — Ambulatory Visit (INDEPENDENT_AMBULATORY_CARE_PROVIDER_SITE_OTHER): Payer: Medicare Other | Admitting: *Deleted

## 2023-06-13 DIAGNOSIS — J309 Allergic rhinitis, unspecified: Secondary | ICD-10-CM | POA: Diagnosis not present

## 2023-06-21 ENCOUNTER — Ambulatory Visit (INDEPENDENT_AMBULATORY_CARE_PROVIDER_SITE_OTHER): Payer: Medicare Other

## 2023-06-21 DIAGNOSIS — J309 Allergic rhinitis, unspecified: Secondary | ICD-10-CM

## 2023-06-28 ENCOUNTER — Ambulatory Visit (INDEPENDENT_AMBULATORY_CARE_PROVIDER_SITE_OTHER)

## 2023-06-28 DIAGNOSIS — J309 Allergic rhinitis, unspecified: Secondary | ICD-10-CM | POA: Diagnosis not present

## 2023-07-05 ENCOUNTER — Ambulatory Visit (INDEPENDENT_AMBULATORY_CARE_PROVIDER_SITE_OTHER)

## 2023-07-05 DIAGNOSIS — J309 Allergic rhinitis, unspecified: Secondary | ICD-10-CM | POA: Diagnosis not present

## 2023-07-12 ENCOUNTER — Ambulatory Visit (INDEPENDENT_AMBULATORY_CARE_PROVIDER_SITE_OTHER)

## 2023-07-12 DIAGNOSIS — J309 Allergic rhinitis, unspecified: Secondary | ICD-10-CM

## 2023-07-14 ENCOUNTER — Encounter: Payer: Self-pay | Admitting: Internal Medicine

## 2023-07-14 ENCOUNTER — Ambulatory Visit: Payer: Medicare Other | Admitting: Internal Medicine

## 2023-07-14 VITALS — BP 122/70 | HR 70 | Temp 98.3°F | Resp 18 | Ht 73.0 in | Wt 220.2 lb

## 2023-07-14 DIAGNOSIS — I10 Essential (primary) hypertension: Secondary | ICD-10-CM

## 2023-07-14 DIAGNOSIS — R7303 Prediabetes: Secondary | ICD-10-CM | POA: Diagnosis not present

## 2023-07-14 DIAGNOSIS — E7800 Pure hypercholesterolemia, unspecified: Secondary | ICD-10-CM

## 2023-07-14 DIAGNOSIS — J4520 Mild intermittent asthma, uncomplicated: Secondary | ICD-10-CM | POA: Diagnosis not present

## 2023-07-14 NOTE — Assessment & Plan Note (Signed)
 His hemoglobin A1c was 6.2 %. He has lost 15 pounds.

## 2023-07-14 NOTE — Progress Notes (Signed)
   Office Visit  Subjective   Patient ID: Jonathan Soto   DOB: July 15, 1950   Age: 73 y.o.   MRN: [id number]   Chief Complaint Chief Complaint  Patient presents with   Follow-up    3 month      History of Present Illness 73 years old male who is here for follow-up. He says that he is doing really good.   Since he is taking weekly allergy injection this winter his asthma also did not bother him much.  . He has hypertension and takes  amlodipine 5 mg and losartan 50 mg daily. His blood pressure is well controlled.  He also has borderline diabetes and his hemoglobin A1c was 6.2  In December but he has lost 15 lb since then.  He is watching his diet.  He also stay very active.  Past Medical History Past Medical History:  Diagnosis Date   Asthma    Hyperlipidemia    Hypertension      Allergies No Known Allergies   Review of Systems Review of Systems  Constitutional: Negative.   HENT: Negative.    Respiratory: Negative.    Cardiovascular: Negative.   Gastrointestinal: Negative.   Neurological: Negative.        Objective:    Vitals BP 122/70 (BP Location: Left Arm, Patient Position: Sitting, Cuff Size: Normal)   Pulse 70   Temp 98.3 F (36.8 C)   Resp 18   Ht 6\' 1"  (1.854 m)   Wt 220 lb 4 oz (99.9 kg)   SpO2 95%   BMI 29.06 kg/m    Physical Examination Physical Exam Constitutional:      Appearance: Normal appearance.  HENT:     Head: Normocephalic and atraumatic.  Cardiovascular:     Rate and Rhythm: Normal rate and regular rhythm.     Heart sounds: Normal heart sounds.  Pulmonary:     Effort: Pulmonary effort is normal.     Breath sounds: Normal breath sounds.  Abdominal:     General: Bowel sounds are normal.     Palpations: Abdomen is soft.  Neurological:     General: No focal deficit present.     Mental Status: He is alert and oriented to person, place, and time.        Assessment & Plan:   Essential hypertension controlled  Asthma Asthma  symptoms are controlled since allergies injection that he gets as routine.  Borderline type 2 diabetes mellitus His hemoglobin A1c was 6.2 %. He has lost 15 pounds.     Return in about 3 months (around 10/14/2023).   Eloisa Northern, MD

## 2023-07-14 NOTE — Assessment & Plan Note (Signed)
 controlled

## 2023-07-14 NOTE — Assessment & Plan Note (Signed)
 Asthma symptoms are controlled since allergies injection that he gets as routine.

## 2023-07-20 ENCOUNTER — Ambulatory Visit (INDEPENDENT_AMBULATORY_CARE_PROVIDER_SITE_OTHER): Admitting: *Deleted

## 2023-07-20 DIAGNOSIS — J309 Allergic rhinitis, unspecified: Secondary | ICD-10-CM | POA: Diagnosis not present

## 2023-07-26 ENCOUNTER — Ambulatory Visit (INDEPENDENT_AMBULATORY_CARE_PROVIDER_SITE_OTHER)

## 2023-07-26 DIAGNOSIS — J309 Allergic rhinitis, unspecified: Secondary | ICD-10-CM | POA: Diagnosis not present

## 2023-08-02 ENCOUNTER — Ambulatory Visit (INDEPENDENT_AMBULATORY_CARE_PROVIDER_SITE_OTHER)

## 2023-08-02 DIAGNOSIS — J309 Allergic rhinitis, unspecified: Secondary | ICD-10-CM

## 2023-08-10 ENCOUNTER — Ambulatory Visit (INDEPENDENT_AMBULATORY_CARE_PROVIDER_SITE_OTHER): Admitting: *Deleted

## 2023-08-10 DIAGNOSIS — J309 Allergic rhinitis, unspecified: Secondary | ICD-10-CM

## 2023-08-21 ENCOUNTER — Ambulatory Visit (INDEPENDENT_AMBULATORY_CARE_PROVIDER_SITE_OTHER): Admitting: *Deleted

## 2023-08-21 DIAGNOSIS — J309 Allergic rhinitis, unspecified: Secondary | ICD-10-CM | POA: Diagnosis not present

## 2023-08-31 ENCOUNTER — Ambulatory Visit (INDEPENDENT_AMBULATORY_CARE_PROVIDER_SITE_OTHER): Payer: Self-pay | Admitting: *Deleted

## 2023-08-31 DIAGNOSIS — J309 Allergic rhinitis, unspecified: Secondary | ICD-10-CM

## 2023-09-06 ENCOUNTER — Ambulatory Visit (INDEPENDENT_AMBULATORY_CARE_PROVIDER_SITE_OTHER)

## 2023-09-06 DIAGNOSIS — J309 Allergic rhinitis, unspecified: Secondary | ICD-10-CM

## 2023-09-13 ENCOUNTER — Ambulatory Visit (INDEPENDENT_AMBULATORY_CARE_PROVIDER_SITE_OTHER)

## 2023-09-13 DIAGNOSIS — J309 Allergic rhinitis, unspecified: Secondary | ICD-10-CM | POA: Diagnosis not present

## 2023-09-21 ENCOUNTER — Ambulatory Visit (INDEPENDENT_AMBULATORY_CARE_PROVIDER_SITE_OTHER): Admitting: *Deleted

## 2023-09-21 DIAGNOSIS — J309 Allergic rhinitis, unspecified: Secondary | ICD-10-CM | POA: Diagnosis not present

## 2023-09-27 ENCOUNTER — Ambulatory Visit (INDEPENDENT_AMBULATORY_CARE_PROVIDER_SITE_OTHER)

## 2023-09-27 DIAGNOSIS — J309 Allergic rhinitis, unspecified: Secondary | ICD-10-CM

## 2023-10-05 ENCOUNTER — Ambulatory Visit (INDEPENDENT_AMBULATORY_CARE_PROVIDER_SITE_OTHER): Payer: Self-pay | Admitting: *Deleted

## 2023-10-05 DIAGNOSIS — J309 Allergic rhinitis, unspecified: Secondary | ICD-10-CM | POA: Diagnosis not present

## 2023-10-12 ENCOUNTER — Ambulatory Visit (INDEPENDENT_AMBULATORY_CARE_PROVIDER_SITE_OTHER): Admitting: *Deleted

## 2023-10-12 DIAGNOSIS — J309 Allergic rhinitis, unspecified: Secondary | ICD-10-CM | POA: Diagnosis not present

## 2023-10-13 ENCOUNTER — Ambulatory Visit: Admitting: Internal Medicine

## 2023-10-13 ENCOUNTER — Encounter: Payer: Self-pay | Admitting: Internal Medicine

## 2023-10-13 VITALS — BP 122/80 | HR 84 | Temp 98.1°F | Resp 18 | Ht 73.0 in | Wt 227.4 lb

## 2023-10-13 DIAGNOSIS — R7303 Prediabetes: Secondary | ICD-10-CM

## 2023-10-13 DIAGNOSIS — I10 Essential (primary) hypertension: Secondary | ICD-10-CM

## 2023-10-13 DIAGNOSIS — Z6830 Body mass index (BMI) 30.0-30.9, adult: Secondary | ICD-10-CM | POA: Insufficient documentation

## 2023-10-13 DIAGNOSIS — J4520 Mild intermittent asthma, uncomplicated: Secondary | ICD-10-CM | POA: Diagnosis not present

## 2023-10-13 MED ORDER — FUROSEMIDE 20 MG PO TABS: 20.0000 mg | ORAL_TABLET | Freq: Every day | ORAL | 3 refills | Status: AC

## 2023-10-13 NOTE — Assessment & Plan Note (Signed)
 Since he has started taking allergy short he did not have any asthma symptoms.  Will continue to monitor.

## 2023-10-13 NOTE — Progress Notes (Signed)
   Office Visit  Subjective   Patient ID: Athens Lebeau   DOB: [birth year]   Age: 73 y.o.   MRN: [id number]   Chief Complaint Chief Complaint  Patient presents with   Hypertension    3 month follow up     History of Present Illness 73 years old male who is here for follow-up. He is getting allergy shot every week and it is helping him. He says that his asthma symptoms and allergy symptoms are controlled. He has no asthma attack in 2 years. He is not usingsays that he is doing really good.   Since he is taking weekly allergy injection this winter his asthma also did not bother him much.  . He has hypertension and takes  amlodipine 5 mg and losartan 50 mg daily. His blood pressure is well controlled.   He also has borderline diabetes and his hemoglobin A1c was 6.2  In December.   Since last visit he has gained  7 more lb and his BMI is 30. I have discussed with him that he need to lose more weight and cut down for shin of his meal. He also stay very active.  Past Medical History Past Medical History:  Diagnosis Date   Asthma    Hyperlipidemia    Hypertension      Allergies No Known Allergies   Review of Systems Review of Systems  Constitutional: Negative.   HENT: Negative.    Respiratory: Negative.    Cardiovascular: Negative.   Gastrointestinal: Negative.   Neurological: Negative.        Objective:    Vitals BP 122/80 (BP Location: Left Arm, Patient Position: Sitting, Cuff Size: Normal)   Pulse 84   Temp 98.1 F (36.7 C)   Resp 18   Ht 6' 1 (1.854 m)   Wt 227 lb 6 oz (103.1 kg)   SpO2 95%   BMI 30.00 kg/m    Physical Examination Physical Exam Constitutional:      Appearance: Normal appearance. He is obese.  HENT:     Head: Normocephalic and atraumatic.   Cardiovascular:     Rate and Rhythm: Normal rate and regular rhythm.     Heart sounds: Normal heart sounds.  Pulmonary:     Effort: Pulmonary effort is normal.     Breath sounds: Normal breath sounds.   Abdominal:     Palpations: Abdomen is soft.   Neurological:     General: No focal deficit present.     Mental Status: He is alert and oriented to person, place, and time.        Assessment & Plan:   Essential hypertension   His blood pressure is controlled.  He takes losartan 100 mg daily and amlodipine 5 mg daily.  Asthma   Since he has started taking allergy short he did not have any asthma symptoms.  Will continue to monitor.  Borderline type 2 diabetes mellitus   He has gained 7 more lb with BMI of 30. He need to cut down portion of his mail particularly carbohydrate.  BMI 30.0-30.9,adult   He will need to lose weight by cutting down portion of his meal.  His BMI is 30.    Return in about 3 months (around 01/13/2024).   Tita Form, MD

## 2023-10-13 NOTE — Assessment & Plan Note (Addendum)
 His blood pressure is controlled.  He takes losartan 100 mg daily and amlodipine 5 mg daily.

## 2023-10-13 NOTE — Assessment & Plan Note (Signed)
 He will need to lose weight by cutting down portion of his meal.  His BMI is 30.

## 2023-10-13 NOTE — Assessment & Plan Note (Signed)
 He has gained 7 more lb with BMI of 30. He need to cut down portion of his mail particularly carbohydrate.

## 2023-10-18 ENCOUNTER — Ambulatory Visit (INDEPENDENT_AMBULATORY_CARE_PROVIDER_SITE_OTHER)

## 2023-10-18 DIAGNOSIS — J309 Allergic rhinitis, unspecified: Secondary | ICD-10-CM | POA: Diagnosis not present

## 2023-10-26 ENCOUNTER — Ambulatory Visit (INDEPENDENT_AMBULATORY_CARE_PROVIDER_SITE_OTHER): Admitting: *Deleted

## 2023-10-26 DIAGNOSIS — J309 Allergic rhinitis, unspecified: Secondary | ICD-10-CM | POA: Diagnosis not present

## 2023-11-01 ENCOUNTER — Ambulatory Visit (INDEPENDENT_AMBULATORY_CARE_PROVIDER_SITE_OTHER)

## 2023-11-01 DIAGNOSIS — J309 Allergic rhinitis, unspecified: Secondary | ICD-10-CM | POA: Diagnosis not present

## 2023-11-08 ENCOUNTER — Ambulatory Visit (INDEPENDENT_AMBULATORY_CARE_PROVIDER_SITE_OTHER)

## 2023-11-08 DIAGNOSIS — J309 Allergic rhinitis, unspecified: Secondary | ICD-10-CM

## 2023-11-16 ENCOUNTER — Ambulatory Visit (INDEPENDENT_AMBULATORY_CARE_PROVIDER_SITE_OTHER): Admitting: *Deleted

## 2023-11-16 DIAGNOSIS — J309 Allergic rhinitis, unspecified: Secondary | ICD-10-CM

## 2023-11-22 ENCOUNTER — Ambulatory Visit (INDEPENDENT_AMBULATORY_CARE_PROVIDER_SITE_OTHER)

## 2023-11-22 DIAGNOSIS — J309 Allergic rhinitis, unspecified: Secondary | ICD-10-CM | POA: Diagnosis not present

## 2023-11-29 ENCOUNTER — Ambulatory Visit (INDEPENDENT_AMBULATORY_CARE_PROVIDER_SITE_OTHER): Admitting: *Deleted

## 2023-11-29 DIAGNOSIS — J309 Allergic rhinitis, unspecified: Secondary | ICD-10-CM | POA: Diagnosis not present

## 2023-12-06 ENCOUNTER — Ambulatory Visit (INDEPENDENT_AMBULATORY_CARE_PROVIDER_SITE_OTHER): Payer: Self-pay | Admitting: *Deleted

## 2023-12-06 DIAGNOSIS — J309 Allergic rhinitis, unspecified: Secondary | ICD-10-CM | POA: Diagnosis not present

## 2023-12-13 ENCOUNTER — Ambulatory Visit (INDEPENDENT_AMBULATORY_CARE_PROVIDER_SITE_OTHER)

## 2023-12-13 DIAGNOSIS — J309 Allergic rhinitis, unspecified: Secondary | ICD-10-CM | POA: Diagnosis not present

## 2023-12-20 ENCOUNTER — Ambulatory Visit (INDEPENDENT_AMBULATORY_CARE_PROVIDER_SITE_OTHER)

## 2023-12-20 DIAGNOSIS — J309 Allergic rhinitis, unspecified: Secondary | ICD-10-CM | POA: Diagnosis not present

## 2023-12-27 ENCOUNTER — Ambulatory Visit (INDEPENDENT_AMBULATORY_CARE_PROVIDER_SITE_OTHER)

## 2023-12-27 DIAGNOSIS — J309 Allergic rhinitis, unspecified: Secondary | ICD-10-CM

## 2024-01-03 ENCOUNTER — Ambulatory Visit (INDEPENDENT_AMBULATORY_CARE_PROVIDER_SITE_OTHER)

## 2024-01-03 DIAGNOSIS — J309 Allergic rhinitis, unspecified: Secondary | ICD-10-CM | POA: Diagnosis not present

## 2024-01-10 ENCOUNTER — Ambulatory Visit (INDEPENDENT_AMBULATORY_CARE_PROVIDER_SITE_OTHER)

## 2024-01-10 DIAGNOSIS — J309 Allergic rhinitis, unspecified: Secondary | ICD-10-CM

## 2024-01-12 ENCOUNTER — Ambulatory Visit: Admitting: Internal Medicine

## 2024-01-15 ENCOUNTER — Other Ambulatory Visit: Payer: Self-pay

## 2024-01-15 MED ORDER — CARVEDILOL 6.25 MG PO TABS: 6.2500 mg | ORAL_TABLET | Freq: Two times a day (BID) | ORAL | 2 refills | Status: AC

## 2024-01-17 ENCOUNTER — Ambulatory Visit (INDEPENDENT_AMBULATORY_CARE_PROVIDER_SITE_OTHER)

## 2024-01-17 DIAGNOSIS — J309 Allergic rhinitis, unspecified: Secondary | ICD-10-CM

## 2024-01-24 ENCOUNTER — Ambulatory Visit (INDEPENDENT_AMBULATORY_CARE_PROVIDER_SITE_OTHER)

## 2024-01-24 DIAGNOSIS — J309 Allergic rhinitis, unspecified: Secondary | ICD-10-CM | POA: Diagnosis not present

## 2024-01-31 ENCOUNTER — Ambulatory Visit (INDEPENDENT_AMBULATORY_CARE_PROVIDER_SITE_OTHER)

## 2024-01-31 DIAGNOSIS — J309 Allergic rhinitis, unspecified: Secondary | ICD-10-CM | POA: Diagnosis not present

## 2024-02-07 ENCOUNTER — Ambulatory Visit (INDEPENDENT_AMBULATORY_CARE_PROVIDER_SITE_OTHER)

## 2024-02-07 DIAGNOSIS — J309 Allergic rhinitis, unspecified: Secondary | ICD-10-CM

## 2024-02-14 ENCOUNTER — Ambulatory Visit (INDEPENDENT_AMBULATORY_CARE_PROVIDER_SITE_OTHER)

## 2024-02-14 DIAGNOSIS — J309 Allergic rhinitis, unspecified: Secondary | ICD-10-CM | POA: Diagnosis not present

## 2024-02-28 ENCOUNTER — Ambulatory Visit

## 2024-02-28 DIAGNOSIS — J309 Allergic rhinitis, unspecified: Secondary | ICD-10-CM

## 2024-02-28 DIAGNOSIS — J302 Other seasonal allergic rhinitis: Secondary | ICD-10-CM | POA: Diagnosis not present

## 2024-03-06 ENCOUNTER — Ambulatory Visit (INDEPENDENT_AMBULATORY_CARE_PROVIDER_SITE_OTHER)

## 2024-03-06 DIAGNOSIS — J309 Allergic rhinitis, unspecified: Secondary | ICD-10-CM

## 2024-03-06 DIAGNOSIS — J302 Other seasonal allergic rhinitis: Secondary | ICD-10-CM | POA: Diagnosis not present

## 2024-03-13 ENCOUNTER — Ambulatory Visit (INDEPENDENT_AMBULATORY_CARE_PROVIDER_SITE_OTHER)

## 2024-03-13 DIAGNOSIS — J3089 Other allergic rhinitis: Secondary | ICD-10-CM

## 2024-03-13 DIAGNOSIS — J309 Allergic rhinitis, unspecified: Secondary | ICD-10-CM | POA: Diagnosis not present

## 2024-03-20 ENCOUNTER — Ambulatory Visit (INDEPENDENT_AMBULATORY_CARE_PROVIDER_SITE_OTHER)

## 2024-03-20 DIAGNOSIS — J3089 Other allergic rhinitis: Secondary | ICD-10-CM

## 2024-03-20 DIAGNOSIS — J309 Allergic rhinitis, unspecified: Secondary | ICD-10-CM

## 2024-03-27 ENCOUNTER — Ambulatory Visit

## 2024-03-27 DIAGNOSIS — J302 Other seasonal allergic rhinitis: Secondary | ICD-10-CM | POA: Diagnosis not present

## 2024-03-27 DIAGNOSIS — J309 Allergic rhinitis, unspecified: Secondary | ICD-10-CM

## 2024-04-03 ENCOUNTER — Ambulatory Visit (INDEPENDENT_AMBULATORY_CARE_PROVIDER_SITE_OTHER)

## 2024-04-03 DIAGNOSIS — J302 Other seasonal allergic rhinitis: Secondary | ICD-10-CM

## 2024-04-03 DIAGNOSIS — J309 Allergic rhinitis, unspecified: Secondary | ICD-10-CM

## 2024-04-11 ENCOUNTER — Ambulatory Visit: Admitting: *Deleted

## 2024-04-11 DIAGNOSIS — J309 Allergic rhinitis, unspecified: Secondary | ICD-10-CM

## 2024-04-11 DIAGNOSIS — J302 Other seasonal allergic rhinitis: Secondary | ICD-10-CM | POA: Diagnosis not present

## 2024-04-16 ENCOUNTER — Ambulatory Visit: Admitting: *Deleted

## 2024-04-16 DIAGNOSIS — J302 Other seasonal allergic rhinitis: Secondary | ICD-10-CM

## 2024-04-16 DIAGNOSIS — J309 Allergic rhinitis, unspecified: Secondary | ICD-10-CM

## 2024-04-24 ENCOUNTER — Ambulatory Visit (INDEPENDENT_AMBULATORY_CARE_PROVIDER_SITE_OTHER)

## 2024-04-24 DIAGNOSIS — J3089 Other allergic rhinitis: Secondary | ICD-10-CM

## 2024-04-24 DIAGNOSIS — J309 Allergic rhinitis, unspecified: Secondary | ICD-10-CM

## 2024-05-15 ENCOUNTER — Ambulatory Visit

## 2024-05-15 DIAGNOSIS — J302 Other seasonal allergic rhinitis: Secondary | ICD-10-CM | POA: Diagnosis not present

## 2024-05-23 ENCOUNTER — Ambulatory Visit (INDEPENDENT_AMBULATORY_CARE_PROVIDER_SITE_OTHER): Admitting: *Deleted

## 2024-05-23 DIAGNOSIS — J302 Other seasonal allergic rhinitis: Secondary | ICD-10-CM

## 2024-05-29 ENCOUNTER — Ambulatory Visit

## 2024-05-29 DIAGNOSIS — J302 Other seasonal allergic rhinitis: Secondary | ICD-10-CM
